# Patient Record
Sex: Female | Born: 1980 | Race: Black or African American | Hispanic: No | Marital: Single | Smoking: Current some day smoker
Health system: Southern US, Community
[De-identification: ages and names within clinical notes are randomized; demographics above are authoritative.]

## PROBLEM LIST (undated history)

## (undated) ENCOUNTER — Emergency Department (HOSPITAL_COMMUNITY): Admission: EM | Payer: Medicaid Other | Source: Home / Self Care

## (undated) DIAGNOSIS — F419 Anxiety disorder, unspecified: Secondary | ICD-10-CM

## (undated) DIAGNOSIS — J40 Bronchitis, not specified as acute or chronic: Secondary | ICD-10-CM

## (undated) DIAGNOSIS — D649 Anemia, unspecified: Secondary | ICD-10-CM

## (undated) DIAGNOSIS — J45909 Unspecified asthma, uncomplicated: Secondary | ICD-10-CM

## (undated) DIAGNOSIS — M549 Dorsalgia, unspecified: Secondary | ICD-10-CM

## (undated) DIAGNOSIS — F32A Depression, unspecified: Secondary | ICD-10-CM

## (undated) HISTORY — PX: TUBAL LIGATION: SHX77

---
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
## 2002-03-07 ENCOUNTER — Ambulatory Visit (HOSPITAL_COMMUNITY): Admission: RE | Admit: 2002-03-07 | Discharge: 2002-03-07 | Payer: Self-pay | Admitting: *Deleted

## 2002-06-13 ENCOUNTER — Inpatient Hospital Stay (HOSPITAL_COMMUNITY): Admission: AD | Admit: 2002-06-13 | Discharge: 2002-06-13 | Payer: Self-pay | Admitting: *Deleted

## 2002-06-14 ENCOUNTER — Inpatient Hospital Stay (HOSPITAL_COMMUNITY): Admission: AD | Admit: 2002-06-14 | Discharge: 2002-06-17 | Payer: Self-pay | Admitting: Obstetrics and Gynecology

## 2002-12-31 ENCOUNTER — Emergency Department (HOSPITAL_COMMUNITY): Admission: EM | Admit: 2002-12-31 | Discharge: 2002-12-31 | Payer: Self-pay | Admitting: Emergency Medicine

## 2003-01-03 ENCOUNTER — Emergency Department (HOSPITAL_COMMUNITY): Admission: EM | Admit: 2003-01-03 | Discharge: 2003-01-03 | Payer: Self-pay | Admitting: Emergency Medicine

## 2003-01-04 ENCOUNTER — Emergency Department (HOSPITAL_COMMUNITY): Admission: EM | Admit: 2003-01-04 | Discharge: 2003-01-04 | Payer: Self-pay | Admitting: Emergency Medicine

## 2003-01-12 ENCOUNTER — Emergency Department (HOSPITAL_COMMUNITY): Admission: EM | Admit: 2003-01-12 | Discharge: 2003-01-12 | Payer: Self-pay | Admitting: Emergency Medicine

## 2003-12-30 ENCOUNTER — Emergency Department (HOSPITAL_COMMUNITY): Admission: EM | Admit: 2003-12-30 | Discharge: 2003-12-30 | Payer: Self-pay | Admitting: Emergency Medicine

## 2004-03-15 ENCOUNTER — Emergency Department (HOSPITAL_COMMUNITY): Admission: EM | Admit: 2004-03-15 | Discharge: 2004-03-15 | Payer: Self-pay | Admitting: Family Medicine

## 2005-02-06 ENCOUNTER — Emergency Department (HOSPITAL_COMMUNITY): Admission: EM | Admit: 2005-02-06 | Discharge: 2005-02-06 | Payer: Self-pay | Admitting: Emergency Medicine

## 2006-01-24 ENCOUNTER — Emergency Department (HOSPITAL_COMMUNITY): Admission: EM | Admit: 2006-01-24 | Discharge: 2006-01-24 | Payer: Self-pay | Admitting: Emergency Medicine

## 2006-01-25 ENCOUNTER — Emergency Department (HOSPITAL_COMMUNITY): Admission: EM | Admit: 2006-01-25 | Discharge: 2006-01-25 | Payer: Self-pay | Admitting: Emergency Medicine

## 2009-01-08 ENCOUNTER — Inpatient Hospital Stay (HOSPITAL_COMMUNITY): Admission: AD | Admit: 2009-01-08 | Discharge: 2009-01-08 | Payer: Self-pay | Admitting: Obstetrics & Gynecology

## 2009-04-02 ENCOUNTER — Ambulatory Visit (HOSPITAL_COMMUNITY): Admission: RE | Admit: 2009-04-02 | Discharge: 2009-04-02 | Payer: Self-pay | Admitting: Obstetrics & Gynecology

## 2009-05-01 ENCOUNTER — Ambulatory Visit (HOSPITAL_COMMUNITY): Admission: RE | Admit: 2009-05-01 | Discharge: 2009-05-01 | Payer: Self-pay | Admitting: Family Medicine

## 2009-07-09 ENCOUNTER — Ambulatory Visit: Payer: Self-pay | Admitting: Family

## 2009-07-09 ENCOUNTER — Inpatient Hospital Stay (HOSPITAL_COMMUNITY): Admission: AD | Admit: 2009-07-09 | Discharge: 2009-07-09 | Payer: Self-pay | Admitting: Obstetrics & Gynecology

## 2009-07-25 ENCOUNTER — Inpatient Hospital Stay (HOSPITAL_COMMUNITY): Admission: AD | Admit: 2009-07-25 | Discharge: 2009-07-25 | Payer: Self-pay | Admitting: Obstetrics & Gynecology

## 2009-08-09 ENCOUNTER — Inpatient Hospital Stay (HOSPITAL_COMMUNITY): Admission: AD | Admit: 2009-08-09 | Discharge: 2009-08-09 | Payer: Self-pay | Admitting: Obstetrics & Gynecology

## 2009-08-09 ENCOUNTER — Ambulatory Visit: Payer: Self-pay | Admitting: Family Medicine

## 2009-08-12 ENCOUNTER — Ambulatory Visit: Payer: Self-pay | Admitting: Obstetrics and Gynecology

## 2009-08-12 ENCOUNTER — Inpatient Hospital Stay (HOSPITAL_COMMUNITY): Admission: AD | Admit: 2009-08-12 | Discharge: 2009-08-12 | Payer: Self-pay | Admitting: Obstetrics & Gynecology

## 2009-08-16 ENCOUNTER — Inpatient Hospital Stay (HOSPITAL_COMMUNITY): Admission: AD | Admit: 2009-08-16 | Discharge: 2009-08-18 | Payer: Self-pay | Admitting: Obstetrics & Gynecology

## 2009-08-16 ENCOUNTER — Ambulatory Visit: Payer: Self-pay | Admitting: Advanced Practice Midwife

## 2009-10-16 ENCOUNTER — Ambulatory Visit: Payer: Self-pay | Admitting: Obstetrics and Gynecology

## 2009-10-16 LAB — CONVERTED CEMR LAB
HCT: 32.6 % — ABNORMAL LOW (ref 36.0–46.0)
Hemoglobin: 10.5 g/dL — ABNORMAL LOW (ref 12.0–15.0)
WBC: 5.4 10*3/uL (ref 4.0–10.5)

## 2009-11-14 ENCOUNTER — Ambulatory Visit: Payer: Self-pay | Admitting: Obstetrics & Gynecology

## 2009-11-25 ENCOUNTER — Ambulatory Visit: Payer: Self-pay | Admitting: Obstetrics & Gynecology

## 2009-11-25 ENCOUNTER — Ambulatory Visit (HOSPITAL_COMMUNITY): Admission: RE | Admit: 2009-11-25 | Discharge: 2009-11-25 | Payer: Self-pay | Admitting: Obstetrics & Gynecology

## 2010-08-31 LAB — WET PREP, GENITAL
Clue Cells Wet Prep HPF POC: NONE SEEN
Trich, Wet Prep: NONE SEEN

## 2010-09-01 LAB — URINALYSIS, ROUTINE W REFLEX MICROSCOPIC
Glucose, UA: NEGATIVE mg/dL
Leukocytes, UA: NEGATIVE
Nitrite: NEGATIVE
Protein, ur: NEGATIVE mg/dL
pH: 6 (ref 5.0–8.0)

## 2010-09-01 LAB — CBC
Hemoglobin: 12.8 g/dL (ref 12.0–15.0)
RBC: 4.65 MIL/uL (ref 3.87–5.11)
RDW: 17.1 % — ABNORMAL HIGH (ref 11.5–15.5)

## 2010-09-01 LAB — URINE MICROSCOPIC-ADD ON

## 2010-09-01 LAB — PREGNANCY, URINE: Preg Test, Ur: NEGATIVE

## 2010-09-07 LAB — RPR: RPR Ser Ql: NONREACTIVE

## 2010-09-07 LAB — CBC
Hemoglobin: 12.1 g/dL (ref 12.0–15.0)
MCHC: 32.5 g/dL (ref 30.0–36.0)
Platelets: 166 10*3/uL (ref 150–400)
RDW: 16 % — ABNORMAL HIGH (ref 11.5–15.5)

## 2010-09-07 LAB — RAPID URINE DRUG SCREEN, HOSP PERFORMED
Amphetamines: NOT DETECTED
Barbiturates: NOT DETECTED
Opiates: NOT DETECTED

## 2010-09-21 LAB — COMPREHENSIVE METABOLIC PANEL
AST: 16 U/L (ref 0–37)
Albumin: 3.5 g/dL (ref 3.5–5.2)
Alkaline Phosphatase: 46 U/L (ref 39–117)
Chloride: 104 mEq/L (ref 96–112)
GFR calc Af Amer: >60 mL/min (ref >60)
Potassium: 3.7 mEq/L (ref 3.5–5.1)
Sodium: 135 mEq/L (ref 135–145)
Total Bilirubin: 0.7 mg/dL (ref 0.3–1.2)
Total Protein: 5.8 g/dL — ABNORMAL LOW (ref 6.0–8.3)

## 2010-09-21 LAB — URINE CULTURE

## 2010-09-21 LAB — URINALYSIS, ROUTINE W REFLEX MICROSCOPIC
Bilirubin Urine: NEGATIVE
Glucose, UA: NEGATIVE mg/dL
Hgb urine dipstick: NEGATIVE
Specific Gravity, Urine: 1.02 (ref 1.005–1.030)
pH: 6 (ref 5.0–8.0)

## 2010-09-21 LAB — URINE MICROSCOPIC-ADD ON

## 2013-06-13 ENCOUNTER — Emergency Department (HOSPITAL_COMMUNITY)
Admission: EM | Admit: 2013-06-13 | Discharge: 2013-06-13 | Disposition: A | Discharge status: Home / Self Care | Payer: Medicaid Other | Attending: Emergency Medicine | Admitting: Emergency Medicine

## 2013-06-13 ENCOUNTER — Encounter (HOSPITAL_COMMUNITY): Payer: Self-pay | Admitting: Emergency Medicine

## 2013-06-13 DIAGNOSIS — J029 Acute pharyngitis, unspecified: Secondary | ICD-10-CM | POA: Insufficient documentation

## 2013-06-13 DIAGNOSIS — H9209 Otalgia, unspecified ear: Secondary | ICD-10-CM | POA: Insufficient documentation

## 2013-06-13 DIAGNOSIS — F17200 Nicotine dependence, unspecified, uncomplicated: Secondary | ICD-10-CM | POA: Insufficient documentation

## 2013-06-13 DIAGNOSIS — G43909 Migraine, unspecified, not intractable, without status migrainosus: Secondary | ICD-10-CM

## 2013-06-13 MED ORDER — BUTALBITAL-APAP-CAFFEINE 50-325-40 MG PO TABS: 1.0000 | ORAL_TABLET | Freq: Four times a day (QID) | ORAL | Status: DC | PRN

## 2013-06-13 MED ORDER — DIPHENHYDRAMINE HCL 25 MG PO CAPS
50.0000 mg | ORAL_CAPSULE | Freq: Once | ORAL | Status: AC
  Administered 2013-06-13: 50 mg via ORAL
  Filled 2013-06-13: qty 2

## 2013-06-13 MED ORDER — PROCHLORPERAZINE MALEATE 10 MG PO TABS
10.0000 mg | ORAL_TABLET | Freq: Once | ORAL | Status: AC
  Administered 2013-06-13: 10 mg via ORAL
  Filled 2013-06-13: qty 1

## 2013-06-13 MED ORDER — KETOROLAC TROMETHAMINE 60 MG/2ML IM SOLN
60.0000 mg | Freq: Once | INTRAMUSCULAR | Status: AC
  Administered 2013-06-13: 60 mg via INTRAMUSCULAR
  Filled 2013-06-13: qty 2

## 2013-06-13 NOTE — ED Provider Notes (Signed)
CSN: [id number]     Arrival date & time 06/13/13  1148 History   First MD Initiated Contact with Patient 06/13/13 1211  This chart was scribed for non-physician practitioner Trixie Dredge, PA-C working with Enid Skeens, MD by Valera Castle, ED scribe. This patient was seen in room WTR8/WTR8 and the patient's care was started at 12:40 PM.    Chief Complaint  Patient presents with  . Facial Pain   The history is provided by the patient. No language interpreter was used.   HPI Comments: Becky Pham is a 32 y.o. female who presents to the Emergency Department complaining of gradually worsening, throbbing, constant, right sided facial pain and right sided headache, with associated right sided sore throat and right ear pain, onset 4 days ago. She reports her facial pain and headache are exacerbated by light and sound and states that her pain is worse in the evenings. She states she thinks she might have an ear infection. She reports taking extra strength Aspirin, without relief. She also reports trying Canola oil in her ear, with no relief. She reports her children have had colds, but states she was sick before them, and they are without symptoms now. She denies fever, chills, body aches, dental pain, rhinorrhea, double vision, blurry vision, nausea, vomiting, dizziness, head trauma, and any other associated symptoms. She denies h/o migraines.   PCP - No primary provider on file.   History reviewed. No pertinent past medical history. Past Surgical History  Procedure Laterality Date  . Tubal ligation     No family history on file. History  Substance Use Topics  . Smoking status: Current Every Day Smoker -- 0.50 packs/day    Types: Cigarettes  . Smokeless tobacco: Not on file  . Alcohol Use: No   OB History   Grav Para Term Preterm Abortions TAB SAB Ect Mult Living                 Review of Systems  Constitutional: Negative for fever and chills.  HENT: Positive for ear pain (right),  facial swelling and sore throat (right). Negative for dental problem and rhinorrhea.   Eyes: Negative for visual disturbance.  Gastrointestinal: Negative for nausea and vomiting.  Musculoskeletal: Negative for myalgias.  Neurological: Positive for headaches (right). Negative for dizziness.    Allergies  Review of patient's allergies indicates no known allergies.  Home Medications  No current outpatient prescriptions on file.  BP 127/94  Pulse 83  Temp(Src) 98.8 F (37.1 C) (Oral)  Resp 16  SpO2 99%  LMP 05/30/2013  Physical Exam  Nursing note and vitals reviewed. Constitutional: She appears well-developed and well-nourished. No distress.  HENT:  Head: Normocephalic and atraumatic.  Right Ear: Tympanic membrane, external ear and ear canal normal.  Left Ear: Tympanic membrane, external ear and ear canal normal.  Nose: Right sinus exhibits no maxillary sinus tenderness and no frontal sinus tenderness. Left sinus exhibits no maxillary sinus tenderness and no frontal sinus tenderness.  Mouth/Throat: Uvula is midline, oropharynx is clear and moist and mucous membranes are normal. No oropharyngeal exudate, posterior oropharyngeal edema, posterior oropharyngeal erythema or tonsillar abscesses.  No abnormal dentition. No oral lesions.   Eyes: Conjunctivae and EOM are normal. Right eye exhibits no discharge. Left eye exhibits no discharge.  Neck: Neck supple. No thyromegaly present.  No lymphadenopathy of head and neck.   Pulmonary/Chest: Effort normal.  Lymphadenopathy:    She has no cervical adenopathy.  Right: No supraclavicular adenopathy present.       Left: No supraclavicular adenopathy present.  Neurological: She is alert.  CN II-XII intact, EOMs intact, no pronator drift, grip strengths equal bilaterally; strength 5/5 in all extremities, sensation intact in all extremities; finger to nose, heel to shin, rapid alternating movements normal; gait is normal.    Skin: She is  not diaphoretic.    ED Course  Procedures (including critical care time)  DIAGNOSTIC STUDIES: Oxygen Saturation is 99% on room air, normal by my interpretation.    COORDINATION OF CARE: 12:47 PM-Discussed treatment plan which includes clinical suspicion of migraine with pt at bedside and pt agreed to plan.   Labs Review Labs Reviewed - No data to display Imaging Review No results found.  EKG Interpretation   None      No orders of the defined types were placed in this encounter.    MDM   1. Migraine headache    Pt with gradual onset right sided headache and facial pain with sensitivity to light and sound x 4 days.  No e/o infectious process causing her symptoms.  I suspect this is a migraine headache.  There are no red flags associated with the headache.  No trauma, no meningeal signs, onset was gradual. Neuro exam is normal.  Pt given toradol, benadryl, compazine in ED and d/c home with fioricet, close PCP follow up.  Discussed findings, treatment, and follow up  with patient.  Pt given return precautions.  Pt verbalizes understanding and agrees with plan.        I personally performed the services described in this documentation, which was scribed in my presence. The recorded information has been reviewed and is accurate.    [place], PA-C 06/13/13 [phone]

## 2013-06-13 NOTE — Progress Notes (Signed)
   CARE MANAGEMENT ED NOTE 06/13/2013  Patient:  Becky Pham, Becky Pham   Account Number:  [id number]  Date Initiated:  06/13/2013  Documentation initiated by:  Edd Arbour  Subjective/Objective Assessment:   32 yr old female medicaid [place] access who confirms the [facility] is her pcp - dr Fleet Contras     Subjective/Objective Assessment Detail:     Action/Plan:   EPIC updated   Action/Plan Detail:   Anticipated DC Date:  06/13/2013     Status Recommendation to Physician:   Result of Recommendation:    Other ED Services  Consult Working Plan    DC Planning Services  Other  PCP issues  Outpatient Services - Pt will follow up    Choice offered to / List presented to:            Status of service:  Completed, signed off  ED Comments:   ED Comments Detail:

## 2013-06-13 NOTE — ED Notes (Addendum)
Pt from home c/o R sided facial pain x4 days. Pt states that she thinks she may have an earache, but not sure. Pt facial pain preventing sleep. Pt reports pain in roof of mouth,R eye, throat, and R side of face. Pt denies N/V/D, dizziness, SOB, cough, weakness. Pt is A&O and in NAD

## 2013-06-13 NOTE — ED Provider Notes (Signed)
Medical screening examination/treatment/procedure(s) were performed by non-physician practitioner and as supervising physician I was immediately available for consultation/collaboration.  EKG Interpretation   None         Noreta Kue M Stefon Ramthun, MD 06/13/13 1555 

## 2014-03-05 ENCOUNTER — Encounter (HOSPITAL_BASED_OUTPATIENT_CLINIC_OR_DEPARTMENT_OTHER): Payer: Self-pay | Admitting: Emergency Medicine

## 2014-03-05 ENCOUNTER — Emergency Department (HOSPITAL_BASED_OUTPATIENT_CLINIC_OR_DEPARTMENT_OTHER)
Admission: EM | Admit: 2014-03-05 | Discharge: 2014-03-05 | Disposition: A | Discharge status: Home / Self Care | Payer: Medicaid Other | Attending: Emergency Medicine | Admitting: Emergency Medicine

## 2014-03-05 DIAGNOSIS — N760 Acute vaginitis: Secondary | ICD-10-CM | POA: Diagnosis not present

## 2014-03-05 DIAGNOSIS — A499 Bacterial infection, unspecified: Secondary | ICD-10-CM | POA: Insufficient documentation

## 2014-03-05 DIAGNOSIS — B9689 Other specified bacterial agents as the cause of diseases classified elsewhere: Secondary | ICD-10-CM | POA: Insufficient documentation

## 2014-03-05 DIAGNOSIS — R109 Unspecified abdominal pain: Secondary | ICD-10-CM | POA: Diagnosis present

## 2014-03-05 DIAGNOSIS — Z9851 Tubal ligation status: Secondary | ICD-10-CM | POA: Diagnosis not present

## 2014-03-05 DIAGNOSIS — Z3202 Encounter for pregnancy test, result negative: Secondary | ICD-10-CM | POA: Insufficient documentation

## 2014-03-05 DIAGNOSIS — E669 Obesity, unspecified: Secondary | ICD-10-CM | POA: Insufficient documentation

## 2014-03-05 DIAGNOSIS — F17200 Nicotine dependence, unspecified, uncomplicated: Secondary | ICD-10-CM | POA: Diagnosis not present

## 2014-03-05 LAB — URINALYSIS, ROUTINE W REFLEX MICROSCOPIC
BILIRUBIN URINE: NEGATIVE
Glucose, UA: NEGATIVE mg/dL
HGB URINE DIPSTICK: NEGATIVE
Ketones, ur: NEGATIVE mg/dL
Nitrite: NEGATIVE
Protein, ur: NEGATIVE mg/dL
SPECIFIC GRAVITY, URINE: 1.014 (ref 1.005–1.030)
UROBILINOGEN UA: 0.2 mg/dL (ref 0.0–1.0)
pH: 5.5 (ref 5.0–8.0)

## 2014-03-05 LAB — RPR

## 2014-03-05 LAB — PREGNANCY, URINE: Preg Test, Ur: NEGATIVE

## 2014-03-05 LAB — WET PREP, GENITAL: Yeast Wet Prep HPF POC: NONE SEEN

## 2014-03-05 LAB — URINE MICROSCOPIC-ADD ON

## 2014-03-05 LAB — HIV ANTIBODY (ROUTINE TESTING W REFLEX): HIV: NONREACTIVE

## 2014-03-05 MED ORDER — CEFTRIAXONE SODIUM 250 MG IJ SOLR
250.0000 mg | Freq: Once | INTRAMUSCULAR | Status: AC
  Administered 2014-03-05: 250 mg via INTRAMUSCULAR
  Filled 2014-03-05: qty 250

## 2014-03-05 MED ORDER — AZITHROMYCIN 250 MG PO TABS
1000.0000 mg | ORAL_TABLET | Freq: Once | ORAL | Status: AC
  Administered 2014-03-05: 1000 mg via ORAL
  Filled 2014-03-05: qty 4

## 2014-03-05 MED ORDER — LIDOCAINE HCL (PF) 1 % IJ SOLN
INTRAMUSCULAR | Status: AC
  Administered 2014-03-05: 5 mL
  Filled 2014-03-05: qty 5

## 2014-03-05 MED ORDER — METRONIDAZOLE 500 MG PO TABS
2000.0000 mg | ORAL_TABLET | Freq: Once | ORAL | Status: AC
  Administered 2014-03-05: 2000 mg via ORAL
  Filled 2014-03-05: qty 4

## 2014-03-05 NOTE — Discharge Instructions (Signed)
Bacterial Vaginosis Bacterial vaginosis is a vaginal infection that occurs when the normal balance of bacteria in the vagina is disrupted. It results from an overgrowth of certain bacteria. This is the most common vaginal infection in women of childbearing age. Treatment is important to prevent complications, especially in pregnant women, as it can cause a premature delivery. CAUSES  Bacterial vaginosis is caused by an increase in harmful bacteria that are normally present in smaller amounts in the vagina. Several different kinds of bacteria can cause bacterial vaginosis. However, the reason that the condition develops is not fully understood. RISK FACTORS Certain activities or behaviors can put you at an increased risk of developing bacterial vaginosis, including:  Having a new sex partner or multiple sex partners.  Douching.  Using an intrauterine device (IUD) for contraception. Women do not get bacterial vaginosis from toilet seats, bedding, swimming pools, or contact with objects around them. SIGNS AND SYMPTOMS  Some women with bacterial vaginosis have no signs or symptoms. Common symptoms include:  Grey vaginal discharge.  A fishlike odor with discharge, especially after sexual intercourse.  Itching or burning of the vagina and vulva.  Burning or pain with urination. DIAGNOSIS  Your health care provider will take a medical history and examine the vagina for signs of bacterial vaginosis. A sample of vaginal fluid may be taken. Your health care provider will look at this sample under a microscope to check for bacteria and abnormal cells. A vaginal pH test may also be done.  TREATMENT  Bacterial vaginosis may be treated with antibiotic medicines. These may be given in the form of a pill or a vaginal cream. A second round of antibiotics may be prescribed if the condition comes back after treatment.  HOME CARE INSTRUCTIONS   Only take over-the-counter or prescription medicines as  directed by your health care provider.  If antibiotic medicine was prescribed, take it as directed. Make sure you finish it even if you start to feel better.  Do not have sex until treatment is completed.  Tell all sexual partners that you have a vaginal infection. They should see their health care provider and be treated if they have problems, such as a mild rash or itching.  Practice safe sex by using condoms and only having one sex partner. SEEK MEDICAL CARE IF:   Your symptoms are not improving after 3 days of treatment.  You have increased discharge or pain.  You have a fever. MAKE SURE YOU:   Understand these instructions.  Will watch your condition.  Will get help right away if you are not doing well or get worse. FOR MORE INFORMATION  Centers for Disease Control and Prevention, Division of STD Prevention: www.cdc.gov/std American Sexual Health Association (ASHA): www.ashastd.org  Document Released: 06/01/2005 Document Revised: 03/22/2013 Document Reviewed: 01/11/2013 ExitCare Patient Information 2015 [employer]. This information is not intended to replace advice given to you by your health care provider. Make sure you discuss any questions you have with your health care provider.  

## 2014-03-05 NOTE — ED Provider Notes (Signed)
CSN: [id number]     Arrival date & time 03/05/14  1306 History   First MD Initiated Contact with Patient 03/05/14 1354     Chief Complaint  Patient presents with  . Abdominal Pain     (Consider location/radiation/quality/duration/timing/severity/associated sxs/prior Treatment) HPI 33 y.o female g2p2 complaining of suprapubic discomfort and vaginal discharge.  Patient states pain present for three weeks.  Patient states she was treated for trichomonas but smell never went away.  She denies uti symptoms to me including dysuria (although it is noted in nursing note.).  LMP two months ago with only spotting last month.  Patient s/p btl four years ago.  History reviewed. No pertinent past medical history. Past Surgical History  Procedure Laterality Date  . Tubal ligation     No family history on file. History  Substance Use Topics  . Smoking status: Current Every Day Smoker -- 0.50 packs/day    Types: Cigarettes  . Smokeless tobacco: Not on file  . Alcohol Use: No   OB History   Grav Para Term Preterm Abortions TAB SAB Ect Mult Living                 Review of Systems  All other systems reviewed and are negative.     Allergies  Review of patient's allergies indicates no known allergies.  Home Medications   Prior to Admission medications   Medication Sig Start Date End Date Taking? Authorizing Provider  butalbital-acetaminophen-caffeine (FIORICET) 50-325-40 MG per tablet Take 1 tablet by mouth every 6 (six) hours as needed for headache. 06/13/13 06/13/14  Trixie Dredge, PA-C   BP 134/71  Pulse 92  Temp(Src) 99 F (37.2 C) (Oral)  Resp 16  Ht  (1.626 m)  Wt 189 lb (85.73 kg)  BMI 32.43 kg/m2  SpO2 100%  LMP 02/04/2014 Physical Exam  Nursing note and vitals reviewed. Constitutional: She appears well-developed and well-nourished.  Obese  HENT:  Head: Normocephalic and atraumatic.  Right Ear: External ear normal.  Left Ear: External ear normal.  Nose: Nose  normal.  Mouth/Throat: Oropharynx is clear and moist.  Eyes: Conjunctivae and EOM are normal. Pupils are equal, round, and reactive to light.  Neck: Normal range of motion. Neck supple. No thyromegaly present.  Cardiovascular: Normal rate.   Pulmonary/Chest: Effort normal.  Abdominal: Soft. Bowel sounds are normal. There is no tenderness.  Genitourinary: Pelvic exam was performed with patient supine. Uterus is enlarged and tender. Uterus is not deviated and not fixed. Cervix exhibits discharge. Cervix exhibits no motion tenderness. Right adnexum displays no mass, no tenderness and no fullness. Left adnexum displays no mass, no tenderness and no fullness. No tenderness or bleeding around the vagina. No vaginal discharge found.  Mild ttp bimanaul over uterus    ED Course  Procedures (including critical care time) Labs Review Labs Reviewed  URINALYSIS, ROUTINE W REFLEX MICROSCOPIC - Abnormal; Notable for the following:    Leukocytes, UA TRACE (*)    All other components within normal limits  GC/CHLAMYDIA PROBE AMP  WET PREP, GENITAL  PREGNANCY, URINE  URINE MICROSCOPIC-ADD ON  RPR  HIV ANTIBODY (ROUTINE TESTING)    Imaging Review No results found.   EKG Interpretation None      MDM   Final diagnoses:  BV (bacterial vaginosis)       Hilario Quarry, MD 03/05/14 [phone]

## 2014-03-05 NOTE — ED Notes (Signed)
abd pain x 3 weeks, dysuria, vaginal d/c

## 2014-03-06 LAB — GC/CHLAMYDIA PROBE AMP
CT Probe RNA: NEGATIVE
GC Probe RNA: NEGATIVE

## 2014-03-10 ENCOUNTER — Encounter (HOSPITAL_BASED_OUTPATIENT_CLINIC_OR_DEPARTMENT_OTHER): Payer: Self-pay | Admitting: Emergency Medicine

## 2014-03-10 ENCOUNTER — Emergency Department (HOSPITAL_BASED_OUTPATIENT_CLINIC_OR_DEPARTMENT_OTHER)
Admission: EM | Admit: 2014-03-10 | Discharge: 2014-03-10 | Disposition: A | Discharge status: Home / Self Care | Payer: Medicaid Other | Attending: Emergency Medicine | Admitting: Emergency Medicine

## 2014-03-10 ENCOUNTER — Emergency Department (HOSPITAL_BASED_OUTPATIENT_CLINIC_OR_DEPARTMENT_OTHER): Payer: Medicaid Other

## 2014-03-10 DIAGNOSIS — R0789 Other chest pain: Secondary | ICD-10-CM

## 2014-03-10 DIAGNOSIS — R079 Chest pain, unspecified: Secondary | ICD-10-CM | POA: Insufficient documentation

## 2014-03-10 DIAGNOSIS — R071 Chest pain on breathing: Secondary | ICD-10-CM | POA: Diagnosis not present

## 2014-03-10 DIAGNOSIS — Z8709 Personal history of other diseases of the respiratory system: Secondary | ICD-10-CM | POA: Insufficient documentation

## 2014-03-10 DIAGNOSIS — F17200 Nicotine dependence, unspecified, uncomplicated: Secondary | ICD-10-CM | POA: Insufficient documentation

## 2014-03-10 HISTORY — DX: Bronchitis, not specified as acute or chronic: J40

## 2014-03-10 MED ORDER — IBUPROFEN 400 MG PO TABS
600.0000 mg | ORAL_TABLET | Freq: Once | ORAL | Status: AC
  Administered 2014-03-10: 600 mg via ORAL
  Filled 2014-03-10 (×2): qty 1

## 2014-03-10 NOTE — ED Provider Notes (Signed)
CSN: [id number]     Arrival date & time 03/10/14  1109 History   First MD Initiated Contact with Patient 03/10/14 1115     Chief Complaint  Patient presents with  . Chest Pain     (Consider location/radiation/quality/duration/timing/severity/associated sxs/prior Treatment) HPI  This is a 33 year old female who presents from day marked with chest pain. Patient reports a three-day history of chest pain that has been constant and is worse with movement. She reports the pain is sharp and nonradiating. She does have a history of smoking and bronchitis. She denies any coughing. She's not taken anything for pain. She denies any fevers. She denies any history of blood clots, estrogen use, recent hospitalizations.  Current pain is 8/10.  Past Medical History  Diagnosis Date  . Bronchitis    Past Surgical History  Procedure Laterality Date  . Tubal ligation     No family history on file. History  Substance Use Topics  . Smoking status: Current Every Day Smoker -- 0.50 packs/day    Types: Cigarettes  . Smokeless tobacco: Not on file  . Alcohol Use: No   OB History   Grav Para Term Preterm Abortions TAB SAB Ect Mult Living                 Review of Systems  Constitutional: Negative for fever.  Respiratory: Positive for chest tightness. Negative for cough and shortness of breath.   Cardiovascular: Positive for chest pain. Negative for leg swelling.  Gastrointestinal: Negative for nausea, vomiting and abdominal pain.  Genitourinary: Negative for dysuria.  Neurological: Negative for headaches.  All other systems reviewed and are negative.     Allergies  Review of patient's allergies indicates no known allergies.  Home Medications   Prior to Admission medications   Not on File   BP 118/83  Pulse 71  Temp(Src) 99 F (37.2 C) (Oral)  Resp 13  Wt 189 lb (85.73 kg)  SpO2 100%  LMP 02/04/2014 Physical Exam  Nursing note and vitals reviewed. Constitutional: She is oriented  to person, place, and time. She appears well-developed and well-nourished. No distress.  HENT:  Head: Normocephalic and atraumatic.  Cardiovascular: Normal rate, regular rhythm and normal heart sounds.   No murmur heard. Pulmonary/Chest: Effort normal and breath sounds normal. No respiratory distress. She has no wheezes. She exhibits tenderness.  TTP the anterior chest wall without crepitus  Abdominal: Soft. Bowel sounds are normal. There is no tenderness. There is no rebound.  Musculoskeletal: She exhibits no edema.  Neurological: She is alert and oriented to person, place, and time.  Skin: Skin is warm and dry.  Psychiatric: She has a normal mood and affect.    ED Course  Procedures (including critical care time) Labs Review Labs Reviewed - No data to display  Imaging Review Dg Chest 2 View  03/10/2014   CLINICAL DATA:  Right-sided chest pain for 3 days, gradually worsening, pain radiating to back, smoker  EXAM: CHEST  2 VIEW  COMPARISON:  01/25/2006  FINDINGS: Normal heart size, mediastinal contours, and pulmonary vascularity.  Lungs clear.  Minimal chronic peribronchial thickening, slightly improved.  No pleural effusion or pneumothorax.  Bones unremarkable.  IMPRESSION: No acute abnormalities.   Electronically Signed   By: Ulyses Southward M.D.   On: 03/10/2014 12:29     EKG Interpretation   Date/Time:  Saturday March 10 2014 11:16:15 EDT Ventricular Rate:  65 PR Interval:  128 QRS Duration: 82 QT Interval:  392 QTC Calculation:  407 R Axis:   85 Text Interpretation:  Normal sinus rhythm Normal ECG No prior for  comparison Confirmed by Tauriel Scronce  MD, Diane Mochizuki ([id number]) on 03/10/2014 11:18:23  AM      MDM   Final diagnoses:  Chest wall pain   Patient presents with chest pain for 3 days. Is worse with movement. She is nontoxic nonfocal on exam. EKG is normal 1 chest x-ray shows no signs of pneumothorax or pleural effusion. She's not wheezing on exam and no indication of  bronchitis. She does have tenderness to palpation suggestive of chest wall pain. Patient was given Toradol with some improvement of her pain. PERC neg.  Discussed with patient that I feel her pain is likely musculoskeletal in nature. She can take ibuprofen as needed.  After history, exam, and medical workup I feel the patient has been appropriately medically screened and is safe for discharge home. Pertinent diagnoses were discussed with the patient. Patient was given return precautions.    Shon Baton, MD 03/11/14 [phone]

## 2014-03-10 NOTE — ED Notes (Signed)
D/c back to [facility]- pt given ice pack and warm pack for home use- [facility] form filled out by EDP Horton and returned to pt at d/c

## 2014-03-10 NOTE — Discharge Instructions (Signed)

## 2014-09-08 ENCOUNTER — Emergency Department (HOSPITAL_BASED_OUTPATIENT_CLINIC_OR_DEPARTMENT_OTHER)
Admission: EM | Admit: 2014-09-08 | Discharge: 2014-09-08 | Disposition: A | Discharge status: Home / Self Care | Payer: Medicaid Other | Attending: Emergency Medicine | Admitting: Emergency Medicine

## 2014-09-08 ENCOUNTER — Encounter (HOSPITAL_BASED_OUTPATIENT_CLINIC_OR_DEPARTMENT_OTHER): Payer: Self-pay

## 2014-09-08 DIAGNOSIS — Z720 Tobacco use: Secondary | ICD-10-CM | POA: Diagnosis not present

## 2014-09-08 DIAGNOSIS — R109 Unspecified abdominal pain: Secondary | ICD-10-CM | POA: Diagnosis present

## 2014-09-08 DIAGNOSIS — A5900 Urogenital trichomoniasis, unspecified: Secondary | ICD-10-CM | POA: Diagnosis not present

## 2014-09-08 DIAGNOSIS — Z3202 Encounter for pregnancy test, result negative: Secondary | ICD-10-CM | POA: Insufficient documentation

## 2014-09-08 DIAGNOSIS — Z8709 Personal history of other diseases of the respiratory system: Secondary | ICD-10-CM | POA: Insufficient documentation

## 2014-09-08 DIAGNOSIS — F419 Anxiety disorder, unspecified: Secondary | ICD-10-CM | POA: Diagnosis not present

## 2014-09-08 DIAGNOSIS — A5901 Trichomonal vulvovaginitis: Secondary | ICD-10-CM

## 2014-09-08 DIAGNOSIS — M549 Dorsalgia, unspecified: Secondary | ICD-10-CM | POA: Insufficient documentation

## 2014-09-08 LAB — URINALYSIS, ROUTINE W REFLEX MICROSCOPIC
BILIRUBIN URINE: NEGATIVE
Glucose, UA: NEGATIVE mg/dL
HGB URINE DIPSTICK: NEGATIVE
KETONES UR: NEGATIVE mg/dL
Nitrite: NEGATIVE
Protein, ur: NEGATIVE mg/dL
SPECIFIC GRAVITY, URINE: 1.01 (ref 1.005–1.030)
Urobilinogen, UA: 0.2 mg/dL (ref 0.0–1.0)
pH: 8 (ref 5.0–8.0)

## 2014-09-08 LAB — WET PREP, GENITAL

## 2014-09-08 LAB — URINE MICROSCOPIC-ADD ON

## 2014-09-08 LAB — CBG MONITORING, ED: GLUCOSE-CAPILLARY: 91 mg/dL (ref 70–99)

## 2014-09-08 LAB — PREGNANCY, URINE: PREG TEST UR: NEGATIVE

## 2014-09-08 MED ORDER — PROMETHAZINE HCL 25 MG PO TABS: 25.0000 mg | ORAL_TABLET | Freq: Four times a day (QID) | ORAL | Status: DC | PRN

## 2014-09-08 MED ORDER — AZITHROMYCIN 250 MG PO TABS
1000.0000 mg | ORAL_TABLET | Freq: Once | ORAL | Status: AC
  Administered 2014-09-08: 1000 mg via ORAL
  Filled 2014-09-08: qty 4

## 2014-09-08 MED ORDER — AZITHROMYCIN 1 G PO PACK: 1.0000 g | PACK | Freq: Once | ORAL | Status: DC

## 2014-09-08 MED ORDER — LIDOCAINE HCL (PF) 1 % IJ SOLN
INTRAMUSCULAR | Status: AC
  Administered 2014-09-08: 0.9 mL
  Filled 2014-09-08: qty 5

## 2014-09-08 MED ORDER — CEFTRIAXONE SODIUM 250 MG IJ SOLR
250.0000 mg | Freq: Once | INTRAMUSCULAR | Status: AC
  Administered 2014-09-08: 250 mg via INTRAMUSCULAR
  Filled 2014-09-08: qty 250

## 2014-09-08 MED ORDER — METRONIDAZOLE 500 MG PO TABS
500.0000 mg | ORAL_TABLET | Freq: Once | ORAL | Status: AC
  Administered 2014-09-08: 500 mg via ORAL
  Filled 2014-09-08: qty 1

## 2014-09-08 MED ORDER — METRONIDAZOLE 500 MG PO TABS: 500.0000 mg | ORAL_TABLET | Freq: Two times a day (BID) | ORAL | Status: DC
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
## 2014-09-08 NOTE — Discharge Instructions (Signed)
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
## 2014-09-09 LAB — HIV ANTIBODY (ROUTINE TESTING W REFLEX): HIV SCREEN 4TH GENERATION: NONREACTIVE

## 2014-09-09 LAB — RPR: RPR: NONREACTIVE

## 2014-09-10 LAB — GC/CHLAMYDIA PROBE AMP (~~LOC~~) NOT AT ARMC
CHLAMYDIA, DNA PROBE: NEGATIVE
NEISSERIA GONORRHEA: NEGATIVE

## 2014-10-29 ENCOUNTER — Emergency Department (HOSPITAL_COMMUNITY)
Admission: EM | Admit: 2014-10-29 | Discharge: 2014-10-29 | Disposition: A | Discharge status: Home / Self Care | Payer: Medicaid Other | Attending: Emergency Medicine | Admitting: Emergency Medicine

## 2014-10-29 ENCOUNTER — Encounter (HOSPITAL_COMMUNITY): Payer: Self-pay | Admitting: *Deleted

## 2014-10-29 ENCOUNTER — Emergency Department (HOSPITAL_COMMUNITY): Payer: Medicaid Other

## 2014-10-29 DIAGNOSIS — Y998 Other external cause status: Secondary | ICD-10-CM | POA: Insufficient documentation

## 2014-10-29 DIAGNOSIS — Z792 Long term (current) use of antibiotics: Secondary | ICD-10-CM | POA: Diagnosis not present

## 2014-10-29 DIAGNOSIS — S3992XA Unspecified injury of lower back, initial encounter: Secondary | ICD-10-CM | POA: Diagnosis present

## 2014-10-29 DIAGNOSIS — Z8709 Personal history of other diseases of the respiratory system: Secondary | ICD-10-CM | POA: Insufficient documentation

## 2014-10-29 DIAGNOSIS — S79912A Unspecified injury of left hip, initial encounter: Secondary | ICD-10-CM | POA: Insufficient documentation

## 2014-10-29 DIAGNOSIS — Z720 Tobacco use: Secondary | ICD-10-CM | POA: Diagnosis not present

## 2014-10-29 DIAGNOSIS — Y92410 Unspecified street and highway as the place of occurrence of the external cause: Secondary | ICD-10-CM | POA: Insufficient documentation

## 2014-10-29 DIAGNOSIS — Y9389 Activity, other specified: Secondary | ICD-10-CM | POA: Diagnosis not present

## 2014-10-29 DIAGNOSIS — S199XXA Unspecified injury of neck, initial encounter: Secondary | ICD-10-CM | POA: Insufficient documentation

## 2014-10-29 DIAGNOSIS — M542 Cervicalgia: Secondary | ICD-10-CM

## 2014-10-29 DIAGNOSIS — M549 Dorsalgia, unspecified: Secondary | ICD-10-CM

## 2014-10-29 MED ORDER — NAPROXEN 500 MG PO TABS: 500.0000 mg | ORAL_TABLET | Freq: Two times a day (BID) | ORAL | Status: DC

## 2014-10-29 MED ORDER — METHOCARBAMOL 500 MG PO TABS: 500.0000 mg | ORAL_TABLET | Freq: Two times a day (BID) | ORAL | Status: DC
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
## 2014-10-29 NOTE — Discharge Instructions (Signed)
Take the prescribed medication as directed. °Follow-up with your primary care physician. °Return to the ED for new or worsening symptoms. ° °

## 2014-12-05 ENCOUNTER — Emergency Department (HOSPITAL_COMMUNITY)
Admission: EM | Admit: 2014-12-05 | Discharge: 2014-12-05 | Disposition: A | Discharge status: Home / Self Care | Payer: Medicaid Other | Attending: Emergency Medicine | Admitting: Emergency Medicine

## 2014-12-05 ENCOUNTER — Encounter (HOSPITAL_COMMUNITY): Payer: Self-pay

## 2014-12-05 DIAGNOSIS — O99331 Smoking (tobacco) complicating pregnancy, first trimester: Secondary | ICD-10-CM | POA: Insufficient documentation

## 2014-12-05 DIAGNOSIS — F17219 Nicotine dependence, cigarettes, with unspecified nicotine-induced disorders: Secondary | ICD-10-CM | POA: Diagnosis not present

## 2014-12-05 DIAGNOSIS — Z3A11 11 weeks gestation of pregnancy: Secondary | ICD-10-CM | POA: Diagnosis not present

## 2014-12-05 DIAGNOSIS — N939 Abnormal uterine and vaginal bleeding, unspecified: Secondary | ICD-10-CM

## 2014-12-05 DIAGNOSIS — O209 Hemorrhage in early pregnancy, unspecified: Secondary | ICD-10-CM | POA: Diagnosis present

## 2014-12-05 DIAGNOSIS — Z8709 Personal history of other diseases of the respiratory system: Secondary | ICD-10-CM | POA: Insufficient documentation

## 2014-12-05 LAB — URINALYSIS, ROUTINE W REFLEX MICROSCOPIC
Bilirubin Urine: NEGATIVE
GLUCOSE, UA: NEGATIVE mg/dL
HGB URINE DIPSTICK: NEGATIVE
KETONES UR: NEGATIVE mg/dL
Leukocytes, UA: NEGATIVE
Nitrite: NEGATIVE
Protein, ur: NEGATIVE mg/dL
Specific Gravity, Urine: 1.017 (ref 1.005–1.030)
Urobilinogen, UA: 0.2 mg/dL (ref 0.0–1.0)
pH: 5.5 (ref 5.0–8.0)

## 2014-12-05 LAB — POC URINE PREG, ED: Preg Test, Ur: NEGATIVE

## 2014-12-05 LAB — COMPREHENSIVE METABOLIC PANEL
ALBUMIN: 3.4 g/dL — AB (ref 3.5–5.0)
ALK PHOS: 51 U/L (ref 38–126)
ALT: 12 U/L — AB (ref 14–54)
AST: 14 U/L — ABNORMAL LOW (ref 15–41)
Anion gap: 6 (ref 5–15)
BUN: 8 mg/dL (ref 6–20)
CALCIUM: 8.7 mg/dL — AB (ref 8.9–10.3)
CO2: 25 mmol/L (ref 22–32)
Chloride: 109 mmol/L (ref 101–111)
Creatinine, Ser: 0.94 mg/dL (ref 0.44–1.00)
Glucose, Bld: 99 mg/dL (ref 65–99)
Potassium: 3.9 mmol/L (ref 3.5–5.1)
SODIUM: 140 mmol/L (ref 135–145)
TOTAL PROTEIN: 5.8 g/dL — AB (ref 6.5–8.1)
Total Bilirubin: 0.7 mg/dL (ref 0.3–1.2)

## 2014-12-05 LAB — CBC WITH DIFFERENTIAL/PLATELET
BASOS PCT: 1 % (ref 0–1)
Basophils Absolute: 0.1 10*3/uL (ref 0.0–0.1)
EOS PCT: 4 % (ref 0–5)
Eosinophils Absolute: 0.2 10*3/uL (ref 0.0–0.7)
HEMATOCRIT: 35.7 % — AB (ref 36.0–46.0)
HEMOGLOBIN: 11.8 g/dL — AB (ref 12.0–15.0)
Lymphocytes Relative: 27 % (ref 12–46)
Lymphs Abs: 1.6 10*3/uL (ref 0.7–4.0)
MCH: 26.8 pg (ref 26.0–34.0)
MCHC: 33.1 g/dL (ref 30.0–36.0)
MCV: 81.1 fL (ref 78.0–100.0)
MONO ABS: 0.9 10*3/uL (ref 0.1–1.0)
Monocytes Relative: 14 % — ABNORMAL HIGH (ref 3–12)
Neutro Abs: 3.2 10*3/uL (ref 1.7–7.7)
Neutrophils Relative %: 54 % (ref 43–77)
Platelets: 202 10*3/uL (ref 150–400)
RBC: 4.4 MIL/uL (ref 3.87–5.11)
RDW: 14.2 % (ref 11.5–15.5)
WBC: 5.9 10*3/uL (ref 4.0–10.5)

## 2014-12-05 LAB — LIPASE, BLOOD: Lipase: 18 U/L — ABNORMAL LOW (ref 22–51)

## 2014-12-05 LAB — WET PREP, GENITAL
CLUE CELLS WET PREP: NONE SEEN
Trich, Wet Prep: NONE SEEN
Yeast Wet Prep HPF POC: NONE SEEN

## 2014-12-05 LAB — ABO/RH: ABO/RH(D): B POS

## 2014-12-05 LAB — HCG, QUANTITATIVE, PREGNANCY: hCG, Beta Chain, Quant, S: <1 m[IU]/mL (ref <5)

## 2014-12-05 NOTE — ED Notes (Signed)
md came to room to talk to pt, pt not in room. Gown on bed. Staff waiting, pt did not return.

## 2014-12-05 NOTE — ED Notes (Signed)
Patient states she has had a large amount of vaginal bleeding x 30 minutes ago and having intermittent lower abdominal pain. Patient states she had 6 positive home pregnancy tests at home. Patient also states she inserted a tampon in 30 minutes ago as well. Patient states she has had a miscarriage in the past.

## 2014-12-05 NOTE — ED Provider Notes (Signed)
CSN: [id number]     Arrival date & time 12/05/14  1342 History   First MD Initiated Contact with Patient 12/05/14 1449     Chief Complaint  Patient presents with  . Vaginal Bleeding  . Abdominal Pain     (Consider location/radiation/quality/duration/timing/severity/associated sxs/prior Treatment) HPI  34 year old female presents with an acute enlargement of vaginal bleeding that started about one hour prior to arrival. Patient states she was in line and noticed sudden onset of pain and bleeding. She has not had her menstrual cycle in 2 months. Over the past couple weeks she has had 5 or 6 positive pregnancy test. Patient states that her tubes have been tied 5 years ago. Denies dysuria. No vaginal discharge or concern for STI. Has also been having multiple loose watery stools today. Denies nausea or vomiting. Patient's main area pain is midline pelvic. Also having low back pain. This pain is sharp and somewhat different from her typical menstrual cycles.  Past Medical History  Diagnosis Date  . Bronchitis    Past Surgical History  Procedure Laterality Date  . Tubal ligation     Family History  Problem Relation Age of Onset  . Family history unknown: Yes   History  Substance Use Topics  . Smoking status: Current Every Day Smoker -- 0.50 packs/day    Types: Cigarettes  . Smokeless tobacco: Never Used  . Alcohol Use: No   OB History    No data available     Review of Systems  Constitutional: Negative for fever.  Gastrointestinal: Positive for abdominal pain and diarrhea. Negative for vomiting.  Genitourinary: Positive for vaginal bleeding. Negative for dysuria and vaginal discharge.  Musculoskeletal: Positive for back pain.  All other systems reviewed and are negative.     Allergies  Review of patient's allergies indicates no known allergies.  Home Medications   Prior to Admission medications   Medication Sig Start Date End Date Taking? Authorizing Provider  Honey 5  GM/5ML SYRP Take 5-10 mLs by mouth daily as needed (for cough).   Yes Historical Provider, MD  Multiple Vitamins-Minerals (CENTRUM ADULTS) TABS Take 1 tablet by mouth daily.   Yes Historical Provider, MD  methocarbamol (ROBAXIN) 500 MG tablet Take 1 tablet (500 mg total) by mouth 2 (two) times daily. Patient not taking: Reported on 12/05/2014 10/29/14   Garlon Hatchet, PA-C  metroNIDAZOLE (FLAGYL) 500 MG tablet Take 1 tablet (500 mg total) by mouth 2 (two) times daily. Patient not taking: Reported on 12/05/2014 09/08/14   Tilden Fossa, MD  naproxen (NAPROSYN) 500 MG tablet Take 1 tablet (500 mg total) by mouth 2 (two) times daily with a meal. Patient not taking: Reported on 12/05/2014 10/29/14   Garlon Hatchet, PA-C  promethazine (PHENERGAN) 25 MG tablet Take 1 tablet (25 mg total) by mouth every 6 (six) hours as needed for nausea or vomiting. Patient not taking: Reported on 12/05/2014 09/08/14   Tilden Fossa, MD   BP 121/73 mmHg  Pulse 91  Temp(Src) 98.4 F (36.9 C) (Oral)  Resp 16  Ht  (1.626 m)  SpO2 97%  LMP 09/15/2014 Physical Exam  Constitutional: She is oriented to person, place, and time. She appears well-developed and well-nourished.  HENT:  Head: Normocephalic and atraumatic.  Right Ear: External ear normal.  Left Ear: External ear normal.  Nose: Nose normal.  Eyes: Right eye exhibits no discharge. Left eye exhibits no discharge.  Cardiovascular: Normal rate, regular rhythm and normal heart sounds.   Pulmonary/Chest:  Effort normal and breath sounds normal.  Abdominal: Soft. Normal appearance. She exhibits no distension. There is tenderness in the suprapubic area.  Genitourinary: Uterus is tender. Uterus is not deviated and not enlarged. Cervix exhibits no motion tenderness and no discharge. Right adnexum displays no mass and no tenderness. Left adnexum displays no mass and no tenderness. There is bleeding (mild amount of blood in vaginal vault) in the vagina.  Neurological:  She is alert and oriented to person, place, and time.  Skin: Skin is warm and dry.  Nursing note and vitals reviewed.   ED Course  Procedures (including critical care time) Labs Review Labs Reviewed  WET PREP, GENITAL - Abnormal; Notable for the following:    WBC, Wet Prep HPF POC RARE (*)    All other components within normal limits  CBC WITH DIFFERENTIAL/PLATELET - Abnormal; Notable for the following:    Hemoglobin 11.8 (*)    HCT 35.7 (*)    Monocytes Relative 14 (*)    All other components within normal limits  COMPREHENSIVE METABOLIC PANEL - Abnormal; Notable for the following:    Calcium 8.7 (*)    Total Protein 5.8 (*)    Albumin 3.4 (*)    AST 14 (*)    ALT 12 (*)    All other components within normal limits  LIPASE, BLOOD - Abnormal; Notable for the following:    Lipase 18 (*)    All other components within normal limits  URINALYSIS, ROUTINE W REFLEX MICROSCOPIC (NOT AT [facility])  HCG, QUANTITATIVE, PREGNANCY  POC URINE PREG, ED  ABO/RH  GC/CHLAMYDIA PROBE AMP ([place]) NOT AT [facility]    Imaging Review No results found.   EKG Interpretation None      MDM   Final diagnoses:  Vaginal bleeding    Patient with vaginal bleeding. She does not appear to be pregnant with a negative urine and blood pregnancy test. Has midline tenderness that would be expected with bleeding but it is not severe and I do not feel ultrasound or CT imaging would be beneficial. No lateralizing signs to suggest ovarian pathology. Hemoglobin is mildly low at 11.8 but not significantly changed. Patient apparently eloped prior to discussing final results.    Pricilla Loveless, MD 12/05/14 [phone]

## 2014-12-06 LAB — GC/CHLAMYDIA PROBE AMP (~~LOC~~) NOT AT ARMC
Chlamydia: NEGATIVE
NEISSERIA GONORRHEA: NEGATIVE

## 2014-12-13 ENCOUNTER — Emergency Department (HOSPITAL_COMMUNITY)
Admission: EM | Admit: 2014-12-13 | Discharge: 2014-12-14 | Disposition: A | Discharge status: Home / Self Care | Payer: Medicaid Other | Attending: Emergency Medicine | Admitting: Emergency Medicine

## 2014-12-13 ENCOUNTER — Encounter (HOSPITAL_COMMUNITY): Payer: Self-pay | Admitting: Emergency Medicine

## 2014-12-13 ENCOUNTER — Emergency Department (HOSPITAL_COMMUNITY): Payer: Medicaid Other

## 2014-12-13 DIAGNOSIS — Z720 Tobacco use: Secondary | ICD-10-CM | POA: Insufficient documentation

## 2014-12-13 DIAGNOSIS — Z8709 Personal history of other diseases of the respiratory system: Secondary | ICD-10-CM | POA: Diagnosis not present

## 2014-12-13 DIAGNOSIS — R0789 Other chest pain: Secondary | ICD-10-CM | POA: Diagnosis not present

## 2014-12-13 DIAGNOSIS — Z79899 Other long term (current) drug therapy: Secondary | ICD-10-CM | POA: Insufficient documentation

## 2014-12-13 DIAGNOSIS — R079 Chest pain, unspecified: Secondary | ICD-10-CM | POA: Diagnosis present

## 2014-12-13 LAB — CBC WITH DIFFERENTIAL/PLATELET
BASOS ABS: 0 10*3/uL (ref 0.0–0.1)
BASOS PCT: 1 % (ref 0–1)
Eosinophils Absolute: 0.1 10*3/uL (ref 0.0–0.7)
Eosinophils Relative: 1 % (ref 0–5)
HEMATOCRIT: 38.8 % (ref 36.0–46.0)
HEMOGLOBIN: 13.5 g/dL (ref 12.0–15.0)
LYMPHS ABS: 2.6 10*3/uL (ref 0.7–4.0)
LYMPHS PCT: 35 % (ref 12–46)
MCH: 27.4 pg (ref 26.0–34.0)
MCHC: 34.8 g/dL (ref 30.0–36.0)
MCV: 78.9 fL (ref 78.0–100.0)
Monocytes Absolute: 0.8 10*3/uL (ref 0.1–1.0)
Monocytes Relative: 11 % (ref 3–12)
NEUTROS PCT: 52 % (ref 43–77)
Neutro Abs: 3.9 10*3/uL (ref 1.7–7.7)
Platelets: 200 10*3/uL (ref 150–400)
RBC: 4.92 MIL/uL (ref 3.87–5.11)
RDW: 13.8 % (ref 11.5–15.5)
WBC: 7.4 10*3/uL (ref 4.0–10.5)

## 2014-12-13 LAB — COMPREHENSIVE METABOLIC PANEL
ALBUMIN: 3.3 g/dL — AB (ref 3.5–5.0)
ALT: 11 U/L — ABNORMAL LOW (ref 14–54)
ANION GAP: 7 (ref 5–15)
AST: 16 U/L (ref 15–41)
Alkaline Phosphatase: 36 U/L — ABNORMAL LOW (ref 38–126)
BILIRUBIN TOTAL: 0.7 mg/dL (ref 0.3–1.2)
BUN: 7 mg/dL (ref 6–20)
CO2: 22 mmol/L (ref 22–32)
CREATININE: 1.05 mg/dL — AB (ref 0.44–1.00)
Calcium: 8.6 mg/dL — ABNORMAL LOW (ref 8.9–10.3)
Chloride: 109 mmol/L (ref 101–111)
GFR calc Af Amer: >60 mL/min (ref >60)
GFR calc non Af Amer: >60 mL/min (ref >60)
Glucose, Bld: 115 mg/dL — ABNORMAL HIGH (ref 65–99)
Potassium: 3.2 mmol/L — ABNORMAL LOW (ref 3.5–5.1)
Sodium: 138 mmol/L (ref 135–145)
Total Protein: 5.4 g/dL — ABNORMAL LOW (ref 6.5–8.1)

## 2014-12-13 LAB — I-STAT TROPONIN, ED: Troponin i, poc: 0.01 ng/mL (ref 0.00–0.08)

## 2014-12-13 MED ORDER — PREDNISONE 20 MG PO TABS
60.0000 mg | ORAL_TABLET | ORAL | Status: AC
  Administered 2014-12-13: 60 mg via ORAL
  Filled 2014-12-13: qty 3

## 2014-12-13 MED ORDER — PREDNISONE 20 MG PO TABS: 60.0000 mg | ORAL_TABLET | Freq: Every day | ORAL | Status: AC

## 2014-12-13 MED ORDER — PREDNISONE 20 MG PO TABS: 60.0000 mg | ORAL_TABLET | Freq: Every day | ORAL | Status: DC

## 2014-12-13 NOTE — ED Notes (Signed)
Pt st's she has hx of bronchitis and now thinks she has pneumonia.  St's chest started hurting yesterday.  Productive cough (green)

## 2014-12-13 NOTE — Discharge Instructions (Signed)
As discussed, your evaluation today has been largely reassuring.  But, it is important that you monitor your condition carefully, and do not hesitate to return to the ED if you develop new, or concerning changes in your condition.  Your pain is likely due to inflammatory changes in the chest wall.  Smoking cessation, will also be beneficial In minimizing your symptoms.  Otherwise, please follow-up with your physician for appropriate ongoing care.  For the next 2 days, please be sure to use your albuterol inhaler every 4 hours in addition to the newly prescribed medication.

## 2014-12-14 NOTE — ED Provider Notes (Signed)
CSN: [id number]     Arrival date & time 12/13/14  1944 History   First MD Initiated Contact with Patient 12/13/14 2054     Chief Complaint  Patient presents with  . Chest Pain     (Consider location/radiation/quality/duration/timing/severity/associated sxs/prior Treatment) HPI Patient presents with concern of ongoing cough, chest pain, mild dyspnea. Symptoms began several days ago, since onset have been progressive, in spite of using home medication, broncho-dilator, OTC therapy. There is no associated fever, chills, headache, syncope, no exertional or pleuritic change. Patient continues to smoke.   Smoking cessation provided, particularly in light of this patient's evaluation in the ED.     Past Medical History  Diagnosis Date  . Bronchitis    Past Surgical History  Procedure Laterality Date  . Tubal ligation     Family History  Problem Relation Age of Onset  . Family history unknown: Yes   History  Substance Use Topics  . Smoking status: Current Every Day Smoker -- 0.50 packs/day    Types: Cigarettes  . Smokeless tobacco: Never Used  . Alcohol Use: No   OB History    No data available     Review of Systems  Constitutional:       Per HPI, otherwise negative  HENT:       Per HPI, otherwise negative  Respiratory:       Per HPI, otherwise negative  Cardiovascular:       Per HPI, otherwise negative  Gastrointestinal: Negative for vomiting.  Endocrine:       Negative aside from HPI  Genitourinary:       Neg aside from HPI   Musculoskeletal:       Per HPI, otherwise negative  Skin: Negative.   Neurological: Negative for syncope.      Allergies  Review of patient's allergies indicates no known allergies.  Home Medications   Prior to Admission medications   Medication Sig Start Date End Date Taking? Authorizing Provider  brompheniramine-pseudoephedrine-DM 30-2-10 MG/5ML syrup Take 5 mLs by mouth 4 (four) times daily as needed (cough, congestion).    Yes Historical Provider, MD  Honey 5 GM/5ML SYRP Take 5-10 mLs by mouth daily as needed (for cough).   Yes Historical Provider, MD  Multiple Vitamins-Minerals (CENTRUM ADULTS) TABS Take 1 tablet by mouth daily.   Yes Historical Provider, MD  methocarbamol (ROBAXIN) 500 MG tablet Take 1 tablet (500 mg total) by mouth 2 (two) times daily. Patient not taking: Reported on 12/05/2014 10/29/14   Garlon HatchetLisa M Sanders, PA-C  metroNIDAZOLE (FLAGYL) 500 MG tablet Take 1 tablet (500 mg total) by mouth 2 (two) times daily. Patient not taking: Reported on 12/05/2014 09/08/14   Tilden FossaElizabeth Rees, MD  naproxen (NAPROSYN) 500 MG tablet Take 1 tablet (500 mg total) by mouth 2 (two) times daily with a meal. Patient not taking: Reported on 12/05/2014 10/29/14   Garlon HatchetLisa M Sanders, PA-C  predniSONE (DELTASONE) 20 MG tablet Take 3 tablets (60 mg total) by mouth daily with breakfast. 12/14/14 12/17/14  Gerhard Munchobert Sonnie Pawloski, MD  promethazine (PHENERGAN) 25 MG tablet Take 1 tablet (25 mg total) by mouth every 6 (six) hours as needed for nausea or vomiting. Patient not taking: Reported on 12/05/2014 09/08/14   Tilden FossaElizabeth Rees, MD   BP 116/69 mmHg  Pulse 87  Temp(Src) 98.4 F (36.9 C) (Oral)  Resp 16  SpO2 93%  LMP 12/05/2014 Physical Exam  Constitutional: She is oriented to person, place, and time. She appears well-developed and well-nourished. No distress.  HENT:  Head: Normocephalic and atraumatic.  Eyes: Conjunctivae and EOM are normal.  Cardiovascular: Normal rate and regular rhythm.   Pulmonary/Chest: Effort normal and breath sounds normal. No stridor. No respiratory distress. She has no wheezes. She has no rales.  Abdominal: She exhibits no distension.  Musculoskeletal: She exhibits no edema.  Neurological: She is alert and oriented to person, place, and time. No cranial nerve deficit.  Skin: Skin is warm and dry.  Psychiatric: She has a normal mood and affect.  Nursing note and vitals reviewed.   ED Course  Procedures (including  critical care time) Labs Review Labs Reviewed  COMPREHENSIVE METABOLIC PANEL - Abnormal; Notable for the following:    Potassium 3.2 (*)    Glucose, Bld 115 (*)    Creatinine, Ser 1.05 (*)    Calcium 8.6 (*)    Total Protein 5.4 (*)    Albumin 3.3 (*)    ALT 11 (*)    Alkaline Phosphatase 36 (*)    All other components within normal limits  CBC WITH DIFFERENTIAL/PLATELET  Rosezena Sensor, ED    Imaging Review Dg Chest 2 View  12/13/2014   CLINICAL DATA:  Chest pain and shortness of breath.  EXAM: CHEST  2 VIEW  COMPARISON:  None.  FINDINGS: The heart size and mediastinal contours are within normal limits. Both lungs are clear. The visualized skeletal structures are unremarkable.  IMPRESSION: No active cardiopulmonary disease.   Electronically Signed   By: Signa Kell M.D.   On: 12/13/2014 20:21     EKG Interpretation   Date/Time:  Thursday December 13 2014 19:48:12 EDT Ventricular Rate:  105 PR Interval:  124 QRS Duration: 80 QT Interval:  330 QTC Calculation: 436 R Axis:   86 Text Interpretation:  Sinus tachycardia Right atrial enlargement  Nonspecific T wave abnormality Abnormal ECG Sinus tachycardia Artifact T  wave abnormality Abnormal ekg Confirmed by Gerhard Munch  MD ([id number]) on  12/13/2014 10:40:31 PM    O2- 96%ra, nml  Cardiac: 85 sr, nml      MDM   Final diagnoses:  Atypical chest pain    Young female, generally well female presents with ongoing cough, congestion, chest tightness associated with coughing. Patient does smoke, otherwise has minimal risk profile for ACS or PE. Patient's labs, vitals reassuring. Patient discharged in stable condition after initiation of broncho-dilator scheduled therapy, steroids.  Gerhard Munch, MD 12/14/14 [phone]

## 2015-02-05 ENCOUNTER — Emergency Department (HOSPITAL_COMMUNITY)
Admission: EM | Admit: 2015-02-05 | Discharge: 2015-02-05 | Disposition: A | Discharge status: Home / Self Care | Payer: Medicaid Other | Attending: Physician Assistant | Admitting: Physician Assistant

## 2015-02-05 ENCOUNTER — Encounter (HOSPITAL_COMMUNITY): Payer: Self-pay

## 2015-02-05 DIAGNOSIS — M549 Dorsalgia, unspecified: Secondary | ICD-10-CM | POA: Insufficient documentation

## 2015-02-05 DIAGNOSIS — Z9851 Tubal ligation status: Secondary | ICD-10-CM | POA: Insufficient documentation

## 2015-02-05 DIAGNOSIS — A5901 Trichomonal vulvovaginitis: Secondary | ICD-10-CM | POA: Diagnosis not present

## 2015-02-05 DIAGNOSIS — R1032 Left lower quadrant pain: Secondary | ICD-10-CM | POA: Diagnosis present

## 2015-02-05 DIAGNOSIS — Z3202 Encounter for pregnancy test, result negative: Secondary | ICD-10-CM | POA: Diagnosis not present

## 2015-02-05 DIAGNOSIS — R109 Unspecified abdominal pain: Secondary | ICD-10-CM

## 2015-02-05 DIAGNOSIS — R112 Nausea with vomiting, unspecified: Secondary | ICD-10-CM | POA: Insufficient documentation

## 2015-02-05 DIAGNOSIS — Z720 Tobacco use: Secondary | ICD-10-CM | POA: Insufficient documentation

## 2015-02-05 DIAGNOSIS — Z79899 Other long term (current) drug therapy: Secondary | ICD-10-CM | POA: Insufficient documentation

## 2015-02-05 DIAGNOSIS — Z8709 Personal history of other diseases of the respiratory system: Secondary | ICD-10-CM | POA: Diagnosis not present

## 2015-02-05 LAB — URINALYSIS, ROUTINE W REFLEX MICROSCOPIC
BILIRUBIN URINE: NEGATIVE
Glucose, UA: NEGATIVE mg/dL
Hgb urine dipstick: NEGATIVE
Ketones, ur: NEGATIVE mg/dL
NITRITE: NEGATIVE
Protein, ur: NEGATIVE mg/dL
SPECIFIC GRAVITY, URINE: 1.015 (ref 1.005–1.030)
UROBILINOGEN UA: 1 mg/dL (ref 0.0–1.0)
pH: 6 (ref 5.0–8.0)

## 2015-02-05 LAB — URINE MICROSCOPIC-ADD ON

## 2015-02-05 LAB — WET PREP, GENITAL: Yeast Wet Prep HPF POC: NONE SEEN

## 2015-02-05 LAB — POC URINE PREG, ED: PREG TEST UR: NEGATIVE

## 2015-02-05 MED ORDER — ACETAMINOPHEN 500 MG PO TABS
1000.0000 mg | ORAL_TABLET | Freq: Once | ORAL | Status: AC
  Administered 2015-02-05: 1000 mg via ORAL
  Filled 2015-02-05: qty 2

## 2015-02-05 MED ORDER — METRONIDAZOLE 500 MG PO TABS
2000.0000 mg | ORAL_TABLET | Freq: Once | ORAL | Status: AC
  Administered 2015-02-05: 2000 mg via ORAL
  Filled 2015-02-05: qty 4

## 2015-02-05 MED ORDER — CEFTRIAXONE SODIUM 250 MG IJ SOLR
250.0000 mg | Freq: Once | INTRAMUSCULAR | Status: AC
  Administered 2015-02-05: 250 mg via INTRAMUSCULAR
  Filled 2015-02-05: qty 250

## 2015-02-05 MED ORDER — STERILE WATER FOR INJECTION IJ SOLN
INTRAMUSCULAR | Status: AC
  Administered 2015-02-05: 10 mL
  Filled 2015-02-05: qty 10

## 2015-02-05 MED ORDER — AZITHROMYCIN 250 MG PO TABS
1000.0000 mg | ORAL_TABLET | Freq: Once | ORAL | Status: AC
  Administered 2015-02-05: 1000 mg via ORAL
  Filled 2015-02-05: qty 4

## 2015-02-05 NOTE — ED Notes (Signed)
Patient c/o left lower abdominal pain that is radiating into the left back. Patient also c/o urinary frequency, but denies vaginal discharge or dysuria.

## 2015-02-05 NOTE — Discharge Instructions (Signed)
No sexual intercourse for 10 days. You are obligated to inform your partner for treatment of trichomonas.  Trichomoniasis Trichomoniasis is an infection caused by an organism called Trichomonas. The infection can affect both women and men. In women, the outer female genitalia and the vagina are affected. In men, the penis is mainly affected, but the prostate and other reproductive organs can also be involved. Trichomoniasis is a sexually transmitted infection (STI) and is most often passed to another person through sexual contact.  RISK FACTORS  Having unprotected sexual intercourse.  Having sexual intercourse with an infected partner. SIGNS AND SYMPTOMS  Symptoms of trichomoniasis in women include:  Abnormal gray-green frothy vaginal discharge.  Itching and irritation of the vagina.  Itching and irritation of the area outside the vagina. Symptoms of trichomoniasis in men include:   Penile discharge with or without pain.  Pain during urination. This results from inflammation of the urethra. DIAGNOSIS  Trichomoniasis may be found during a Pap test or physical exam. Your health care provider may use one of the following methods to help diagnose this infection:  Examining vaginal discharge under a microscope. For men, urethral discharge would be examined.  Testing the pH of the vagina with a test tape.  Using a vaginal swab test that checks for the Trichomonas organism. A test is available that provides results within a few minutes.  Doing a culture test for the organism. This is not usually needed. TREATMENT   You may be given medicine to fight the infection. Women should inform their health care provider if they could be or are pregnant. Some medicines used to treat the infection should not be taken during pregnancy.  Your health care provider may recommend over-the-counter medicines or creams to decrease itching or irritation.  Your sexual partner will need to be treated if  infected. HOME CARE INSTRUCTIONS   Take medicines only as directed by your health care provider.  Take over-the-counter medicine for itching or irritation as directed by your health care provider.  Do not have sexual intercourse while you have the infection.  Women should not douche or wear tampons while they have the infection.  Discuss your infection with your partner. Your partner may have gotten the infection from you, or you may have gotten it from your partner.  Have your sex partner get examined and treated if necessary.  Practice safe, informed, and protected sex.  See your health care provider for other STI testing. SEEK MEDICAL CARE IF:   You still have symptoms after you finish your medicine.  You develop abdominal pain.  You have pain when you urinate.  You have bleeding after sexual intercourse.  You develop a rash.  Your medicine makes you sick or makes you throw up (vomit). MAKE SURE YOU:  Understand these instructions.  Will watch your condition.  Will get help right away if you are not doing well or get worse. Document Released: 11/25/2000 Document Revised: 10/16/2013 Document Reviewed: 03/13/2013 [facility] Patient Information 2015 [place], [place]. This information is not intended to replace advice given to you by your health care provider. Make sure you discuss any questions you have with your health care provider.  Abdominal Pain, Women Abdominal (stomach, pelvic, or belly) pain can be caused by many things. It is important to tell your doctor:  The location of the pain.  Does it come and go or is it present all the time?  Are there things that start the pain (eating certain foods, exercise)?  Are  there other symptoms associated with the pain (fever, nausea, vomiting, diarrhea)? All of this is helpful to know when trying to find the cause of the pain. CAUSES   Stomach: virus or bacteria infection, or ulcer.  Intestine: appendicitis (inflamed  appendix), regional ileitis (Crohn's disease), ulcerative colitis (inflamed colon), irritable bowel syndrome, diverticulitis (inflamed diverticulum of the colon), or cancer of the stomach or intestine.  Gallbladder disease or stones in the gallbladder.  Kidney disease, kidney stones, or infection.  Pancreas infection or cancer.  Fibromyalgia (pain disorder).  Diseases of the female organs:  Uterus: fibroid (non-cancerous) tumors or infection.  Fallopian tubes: infection or tubal pregnancy.  Ovary: cysts or tumors.  Pelvic adhesions (scar tissue).  Endometriosis (uterus lining tissue growing in the pelvis and on the pelvic organs).  Pelvic congestion syndrome (female organs filling up with blood just before the menstrual period).  Pain with the menstrual period.  Pain with ovulation (producing an egg).  Pain with an IUD (intrauterine device, birth control) in the uterus.  Cancer of the female organs.  Functional pain (pain not caused by a disease, may improve without treatment).  Psychological pain.  Depression. DIAGNOSIS  Your doctor will decide the seriousness of your pain by doing an examination.  Blood tests.  X-rays.  Ultrasound.  CT scan (computed tomography, special type of X-ray).  MRI (magnetic resonance imaging).  Cultures, for infection.  Barium enema (dye inserted in the large intestine, to better view it with X-rays).  Colonoscopy (looking in intestine with a lighted tube).  Laparoscopy (minor surgery, looking in abdomen with a lighted tube).  Major abdominal exploratory surgery (looking in abdomen with a large incision). TREATMENT  The treatment will depend on the cause of the pain.   Many cases can be observed and treated at home.  Over-the-counter medicines recommended by your caregiver.  Prescription medicine.  Antibiotics, for infection.  Birth control pills, for painful periods or for ovulation pain.  Hormone treatment, for  endometriosis.  Nerve blocking injections.  Physical therapy.  Antidepressants.  Counseling with a psychologist or psychiatrist.  Minor or major surgery. HOME CARE INSTRUCTIONS   Do not take laxatives, unless directed by your caregiver.  Take over-the-counter pain medicine only if ordered by your caregiver. Do not take aspirin because it can cause an upset stomach or bleeding.  Try a clear liquid diet (broth or water) as ordered by your caregiver. Slowly move to a bland diet, as tolerated, if the pain is related to the stomach or intestine.  Have a thermometer and take your temperature several times a day, and record it.  Bed rest and sleep, if it helps the pain.  Avoid sexual intercourse, if it causes pain.  Avoid stressful situations.  Keep your follow-up appointments and tests, as your caregiver orders.  If the pain does not go away with medicine or surgery, you may try:  Acupuncture.  Relaxation exercises (yoga, meditation).  Group therapy.  Counseling. SEEK MEDICAL CARE IF:   You notice certain foods cause stomach pain.  Your home care treatment is not helping your pain.  You need stronger pain medicine.  You want your IUD removed.  You feel faint or lightheaded.  You develop nausea and vomiting.  You develop a rash.  You are having side effects or an allergy to your medicine. SEEK IMMEDIATE MEDICAL CARE IF:   Your pain does not go away or gets worse.  You have a fever.  Your pain is felt only in portions of  the abdomen. The right side could possibly be appendicitis. The left lower portion of the abdomen could be colitis or diverticulitis.  You are passing blood in your stools (bright red or black tarry stools, with or without vomiting).  You have blood in your urine.  You develop chills, with or without a fever.  You pass out. MAKE SURE YOU:   Understand these instructions.  Will watch your condition.  Will get help right away if you  are not doing well or get worse. Document Released: 03/29/2007 Document Revised: 10/16/2013 Document Reviewed: 04/18/2009 [facility] Patient Information 2015 [place], [place]. This information is not intended to replace advice given to you by your health care provider. Make sure you discuss any questions you have with your health care provider.

## 2015-02-05 NOTE — ED Provider Notes (Signed)
CSN: [id number]     Arrival date & time 02/05/15  1747 History   First MD Initiated Contact with Patient 02/05/15 1816     Chief Complaint  Patient presents with  . Abdominal Pain     (Consider location/radiation/quality/duration/timing/severity/associated sxs/prior Treatment) HPI Comments: 34 year old female complaining of gradual onset left lower abdominal pain radiating to the left side of her back 2 days. Pain is sharp and tender in nature, worse when laying on her left side. No alleviating factors tried. Admits to associated increased urinary frequency without dysuria or hematuria. LMP 02/01/2015. Denies vaginal bleeding or discharge. Admits to occasional mild nausea and had one episode of nonbloody, nonbilious emesis yesterday. No fevers or diarrhea.  Patient is a 34 y.o. female presenting with abdominal pain. The history is provided by the patient.  Abdominal Pain Associated symptoms: nausea (none today) and vomiting (1 episode yesterday, none today)     Past Medical History  Diagnosis Date  . Bronchitis    Past Surgical History  Procedure Laterality Date  . Tubal ligation     Family History  Problem Relation Age of Onset  . Family history unknown: Yes   Social History  Substance Use Topics  . Smoking status: Current Every Day Smoker -- 0.50 packs/day    Types: Cigarettes  . Smokeless tobacco: Never Used  . Alcohol Use: No   OB History    No data available     Review of Systems  Gastrointestinal: Positive for nausea (none today), vomiting (1 episode yesterday, none today) and abdominal pain.  Musculoskeletal: Positive for back pain.  All other systems reviewed and are negative.     Allergies  Review of patient's allergies indicates no known allergies.  Home Medications   Prior to Admission medications   Medication Sig Start Date End Date Taking? Authorizing Provider  Multiple Vitamins-Minerals (CENTRUM ADULTS) TABS Take 1 tablet by mouth daily.   Yes  Historical Provider, MD  methocarbamol (ROBAXIN) 500 MG tablet Take 1 tablet (500 mg total) by mouth 2 (two) times daily. Patient not taking: Reported on 12/05/2014 10/29/14   Garlon Hatchet, PA-C  metroNIDAZOLE (FLAGYL) 500 MG tablet Take 1 tablet (500 mg total) by mouth 2 (two) times daily. Patient not taking: Reported on 12/05/2014 09/08/14   Tilden Fossa, MD  naproxen (NAPROSYN) 500 MG tablet Take 1 tablet (500 mg total) by mouth 2 (two) times daily with a meal. Patient not taking: Reported on 12/05/2014 10/29/14   Garlon Hatchet, PA-C  promethazine (PHENERGAN) 25 MG tablet Take 1 tablet (25 mg total) by mouth every 6 (six) hours as needed for nausea or vomiting. Patient not taking: Reported on 12/05/2014 09/08/14   Tilden Fossa, MD   BP 109/60 mmHg  Pulse 82  Temp(Src) 98.3 F (36.8 C) (Oral)  SpO2 100%  LMP 01/22/2015 Physical Exam  Constitutional: She is oriented to person, place, and time. She appears well-developed and well-nourished. No distress.  HENT:  Head: Normocephalic and atraumatic.  Mouth/Throat: Oropharynx is clear and moist.  Eyes: Conjunctivae and EOM are normal.  Neck: Normal range of motion. Neck supple.  Cardiovascular: Normal rate, regular rhythm and normal heart sounds.   Pulmonary/Chest: Effort normal and breath sounds normal. No respiratory distress.  Abdominal: Normal appearance. She exhibits no distension.  Mild left upper and lower tenderness. No rigidity, guarding or rebound. No peritoneal signs. No CVAT.  Genitourinary: Uterus normal. Cervix exhibits no motion tenderness, no discharge and no friability. Right adnexum displays no mass,  no tenderness and no fullness. Left adnexum displays no mass, no tenderness and no fullness. No tenderness or bleeding in the vagina. Vaginal discharge found.  Musculoskeletal: Normal range of motion. She exhibits no edema.  Neurological: She is alert and oriented to person, place, and time. No sensory deficit.  Skin: Skin is  warm and dry.  Psychiatric: She has a normal mood and affect. Her behavior is normal.  Nursing note and vitals reviewed.   ED Course  Procedures (including critical care time) Labs Review Labs Reviewed  WET PREP, GENITAL - Abnormal; Notable for the following:    Trich, Wet Prep MANY (*)    Clue Cells Wet Prep HPF POC MANY (*)    WBC, Wet Prep HPF POC RARE (*)    All other components within normal limits  URINALYSIS, ROUTINE W REFLEX MICROSCOPIC (NOT AT [facility]) - Abnormal; Notable for the following:    APPearance CLOUDY (*)    Leukocytes, UA SMALL (*)    All other components within normal limits  URINE MICROSCOPIC-ADD ON - Abnormal; Notable for the following:    Squamous Epithelial / LPF MANY (*)    All other components within normal limits  POC URINE PREG, ED  GC/CHLAMYDIA PROBE AMP ([place]) NOT AT [facility]    Imaging Review No results found. I have personally reviewed and evaluated these images and lab results as part of my medical decision-making.   EKG Interpretation None      MDM   Final diagnoses:  Trichomonas vaginitis  Left sided abdominal pain   Nontoxic appearing, NAD. AF VSS. Abdomen is soft with no peritoneal signs. Wet prep significant for many trichomonas. GC/Chlamydia cultures pending. No CMT or adnexal tenderness. Doubt PID. Doubt ovarian conversion, colitis, diverticulitis, renal stone. Rocephin, azithromycin and Flagyl given in ED. Safe sexual practices discussed. Follow-up with PCP. Stable for discharge. Return precautions given. Patient states understanding of treatment care plan and is agreeable.  Kathrynn Speed, PA-C 02/05/15 1933  Courteney Randall An, MD 02/05/15 [phone]

## 2015-02-07 LAB — GC/CHLAMYDIA PROBE AMP (~~LOC~~) NOT AT ARMC
Chlamydia: NEGATIVE
Neisseria Gonorrhea: NEGATIVE

## 2015-08-13 IMAGING — CR DG CHEST 2V
2 series · 2 of 2 positions shown · non-contrast
Comparison: 01/25/2006

CLINICAL DATA: Right-sided chest pain for 3 days, gradually
worsening, pain radiating to back, smoker

EXAM:
CHEST  2 VIEW

[w chest pa]
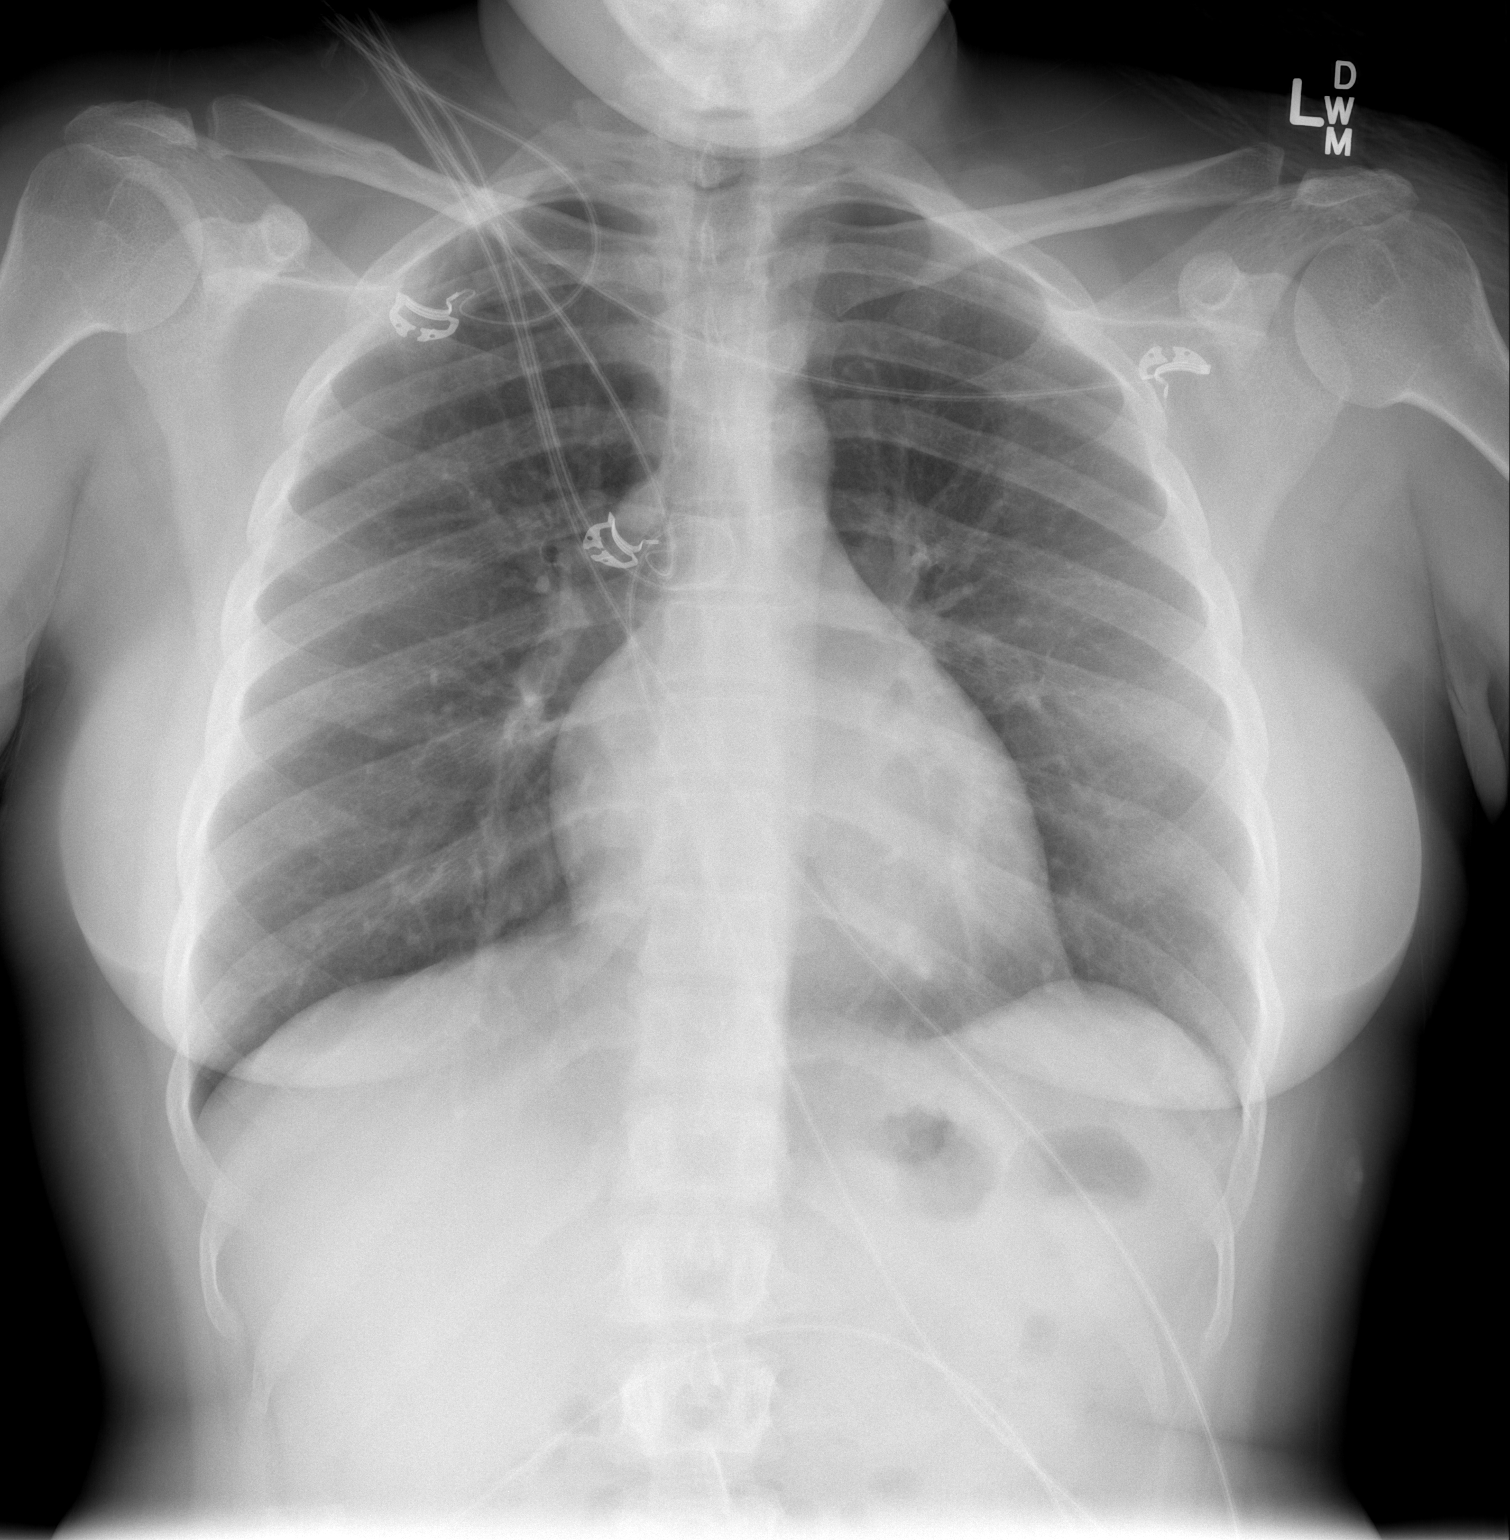

[w chest lat]
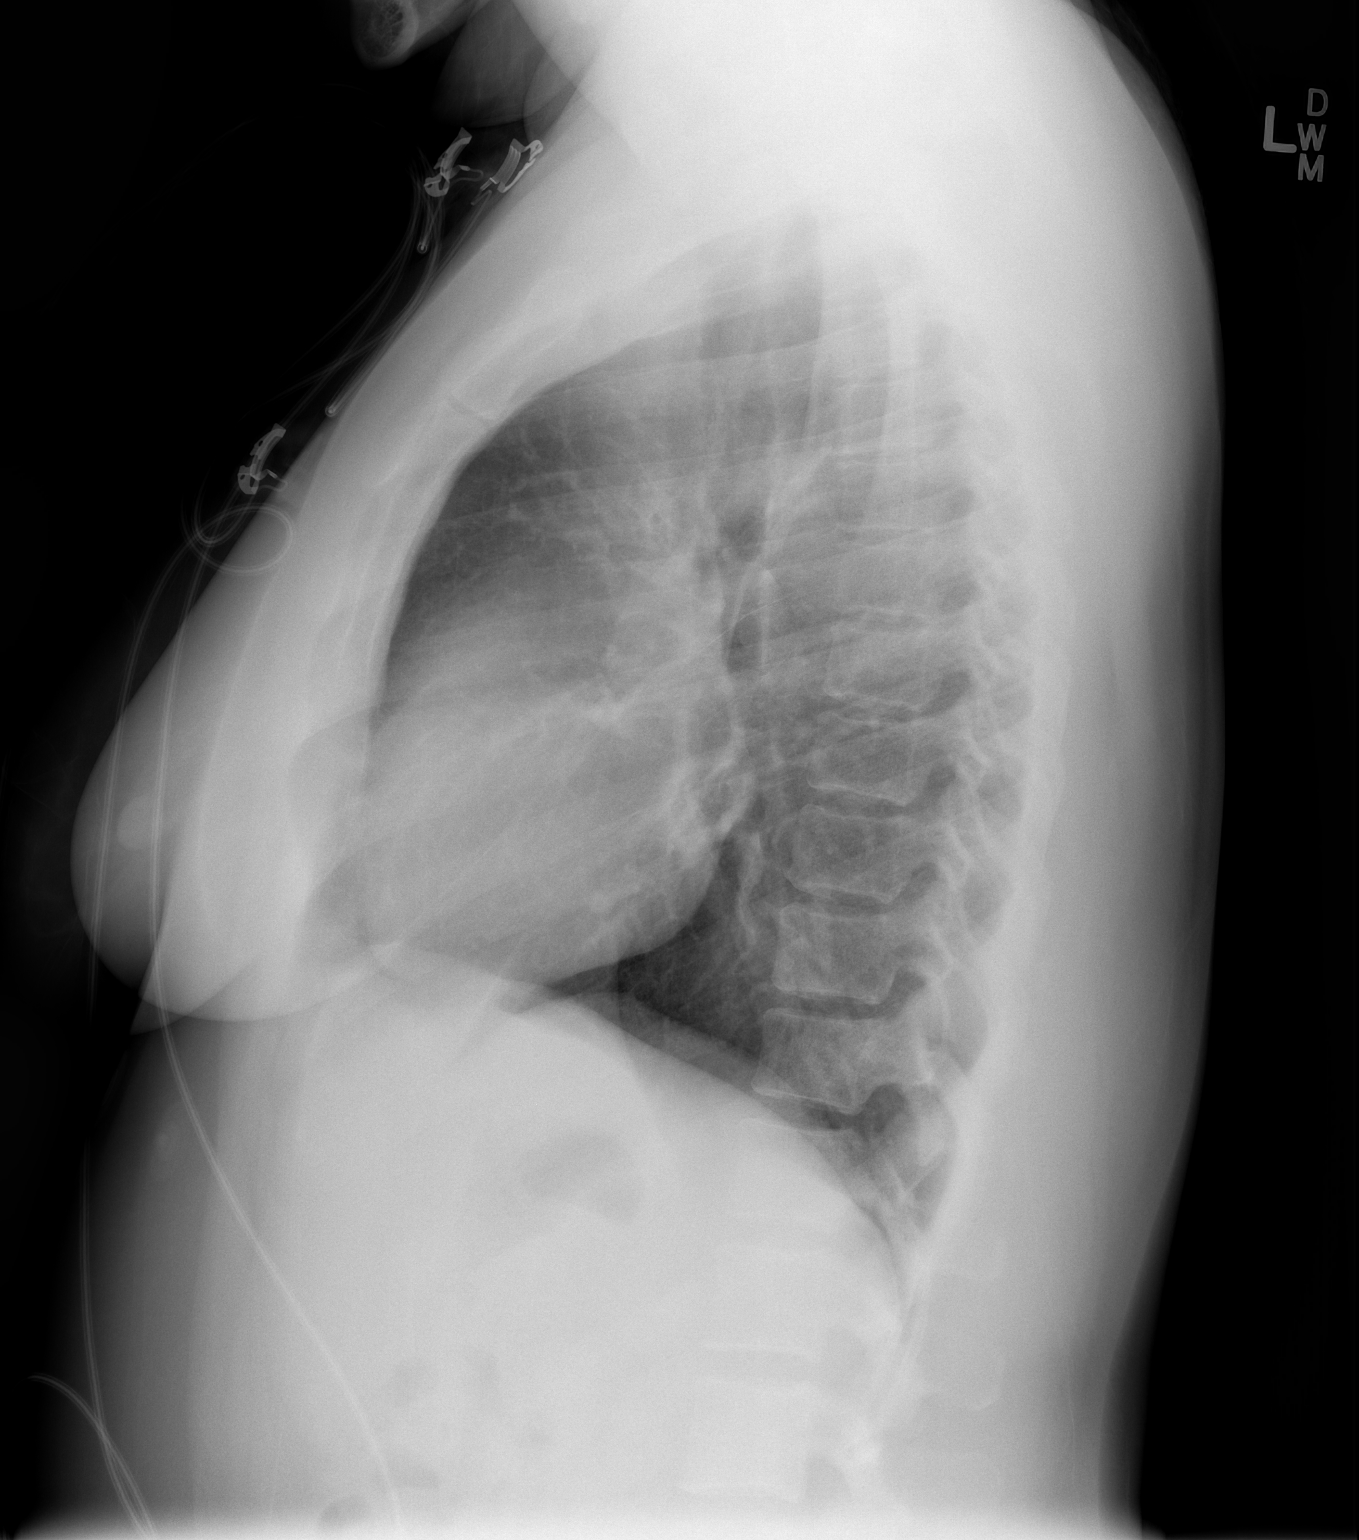

[2 of 2 positions shown; findings below may reference images not displayed]

FINDINGS: Normal heart size, mediastinal contours, and pulmonary vascularity.

Lungs clear.

Minimal chronic peribronchial thickening, slightly improved.

No pleural effusion or pneumothorax.

Bones unremarkable.
IMPRESSION: No acute abnormalities.

## 2015-10-13 ENCOUNTER — Emergency Department (HOSPITAL_COMMUNITY)
Admission: EM | Admit: 2015-10-13 | Discharge: 2015-10-13 | Disposition: A | Discharge status: Home / Self Care | Payer: Medicaid Other | Attending: Emergency Medicine | Admitting: Emergency Medicine

## 2015-10-13 ENCOUNTER — Emergency Department (HOSPITAL_COMMUNITY): Payer: Medicaid Other

## 2015-10-13 ENCOUNTER — Encounter (HOSPITAL_COMMUNITY): Payer: Self-pay | Admitting: Emergency Medicine

## 2015-10-13 DIAGNOSIS — Z79899 Other long term (current) drug therapy: Secondary | ICD-10-CM | POA: Diagnosis not present

## 2015-10-13 DIAGNOSIS — G629 Polyneuropathy, unspecified: Secondary | ICD-10-CM

## 2015-10-13 DIAGNOSIS — F17210 Nicotine dependence, cigarettes, uncomplicated: Secondary | ICD-10-CM | POA: Diagnosis not present

## 2015-10-13 DIAGNOSIS — R202 Paresthesia of skin: Secondary | ICD-10-CM

## 2015-10-13 DIAGNOSIS — M79671 Pain in right foot: Secondary | ICD-10-CM | POA: Diagnosis present

## 2015-10-13 LAB — CBG MONITORING, ED: Glucose-Capillary: 97 mg/dL (ref 65–99)

## 2015-10-13 MED ORDER — GABAPENTIN 300 MG PO CAPS: 300.0000 mg | ORAL_CAPSULE | Freq: Two times a day (BID) | ORAL | Status: DC

## 2015-10-13 NOTE — ED Provider Notes (Signed)
CSN: [id number]     Arrival date & time 10/13/15  0344 History   First MD Initiated Contact with Patient 10/13/15 [phone]     Chief Complaint  Patient presents with  . Foot Pain    right, states is is numb and swollen     (Consider location/radiation/quality/duration/timing/severity/associated sxs/prior Treatment) HPI Comments: Patient reports that she has been experiencing swelling and numbness of her right foot for 3 days. She denies direct injury. She does report that there is a family history of diabetes, is concerned that this might be the cause. There are no symptoms above the ankle. She is complaining mainly of numbness across the dorsal aspect of the toes and distal foot. She has not noticed any drainage, redness or skin change. She denies pain.  Patient is a 35 y.o. female presenting with lower extremity pain.  Foot Pain    Past Medical History  Diagnosis Date  . Bronchitis    Past Surgical History  Procedure Laterality Date  . Tubal ligation     Family History  Problem Relation Age of Onset  . Family history unknown: Yes   Social History  Substance Use Topics  . Smoking status: Current Every Day Smoker -- 0.25 packs/day    Types: Cigarettes  . Smokeless tobacco: Never Used  . Alcohol Use: No     Comment: occ   OB History    No data available     Review of Systems  Neurological: Positive for numbness.  All other systems reviewed and are negative.     Allergies  Review of patient's allergies indicates no known allergies.  Home Medications   Prior to Admission medications   Medication Sig Start Date End Date Taking? Authorizing Provider  Multiple Vitamins-Minerals (CENTRUM ADULTS) TABS Take 1 tablet by mouth daily.    Historical Provider, MD   BP 124/77 mmHg  Pulse 89  Temp(Src) 98.1 F (36.7 C) (Oral)  Resp 20  Ht 5\' 4"  (1.626 m)  Wt 160 lb 6.4 oz (72.757 kg)  BMI 27.52 kg/m2  SpO2 94%  LMP 10/10/2015 (Exact Date) Physical Exam   Constitutional: She is oriented to person, place, and time. She appears well-developed and well-nourished. No distress.  HENT:  Head: Normocephalic and atraumatic.  Right Ear: Hearing normal.  Left Ear: Hearing normal.  Nose: Nose normal.  Mouth/Throat: Oropharynx is clear and moist and mucous membranes are normal.  Eyes: Conjunctivae and EOM are normal. Pupils are equal, round, and reactive to light.  Neck: Normal range of motion. Neck supple.  Cardiovascular: Regular rhythm, S1 normal and S2 normal.  Exam reveals no gallop and no friction rub.   No murmur heard. Pulses:      Dorsalis pedis pulses are 2+ on the right side.  Pulmonary/Chest: Effort normal and breath sounds normal. No respiratory distress. She exhibits no tenderness.  Abdominal: Soft. Normal appearance and bowel sounds are normal. There is no hepatosplenomegaly. There is no tenderness. There is no rebound, no guarding, no tenderness at McBurney's point and negative Murphy's sign. No hernia.  Musculoskeletal: Normal range of motion.       Right lower leg: She exhibits no tenderness and no swelling.  Neurological: She is alert and oriented to person, place, and time. She has normal strength. No cranial nerve deficit or sensory deficit. Coordination normal. GCS eye subscore is 4. GCS verbal subscore is 5. GCS motor subscore is 6.  Skin: Skin is warm, dry and intact. No rash noted. No cyanosis.  Psychiatric: She has a normal mood and affect. Her speech is normal and behavior is normal. Thought content normal.  Nursing note and vitals reviewed.   ED Course  Procedures (including critical care time) Labs Review Labs Reviewed  CBG MONITORING, ED    Imaging Review Dg Foot Complete Right  10/13/2015  CLINICAL DATA:  Right foot pain and numbness for 3 days. EXAM: RIGHT FOOT COMPLETE - 3+ VIEW COMPARISON:  None. FINDINGS: There is no evidence of fracture or dislocation. There is no evidence of arthropathy or other focal bone  abnormality. Soft tissues are unremarkable. IMPRESSION: Negative. Electronically Signed   By: Marnee Spring M.D.   On: 10/13/2015 06:11   I have personally reviewed and evaluated these images and lab results as part of my medical decision-making.   EKG Interpretation None      MDM   Final diagnoses:  None  parasthesia Neuropathy   Patient presents to the emergency department for evaluation of numbness and swelling of her right foot. She denies injury. Examination reveals normal capillary refill of the toes. She has normal range of motion. Sensation is intact. No overlying signs of infection. Dorsalis pedal pulses palpable, no concern for arterial insufficiency. She does not have any calf swelling or concern for DVT. X-ray was unremarkable. Patient concerned about the possibility of diabetes, blood sugar was normal. Vital signs are all normal. I do not believe she requires any further workup. As the symptoms are isolated to the distal portion of the foot and toes, no concern for stroke or CNS involvement. This is likely a peripheral neuropathy that will improve on its own, does not require any further intervention.    Gilda Crease, MD 10/13/15 [phone]

## 2015-10-13 NOTE — Discharge Instructions (Signed)
Paresthesia Paresthesia is an abnormal burning or prickling sensation. This sensation is generally felt in the hands, arms, legs, or feet. However, it may occur in any part of the body. Usually, it is not painful. The feeling may be described as:  Tingling or numbness.  Pins and needles.  Skin crawling.  Buzzing.  Limbs falling asleep.  Itching. Most people experience temporary (transient) paresthesia at some time in their lives. Paresthesia may occur when you breathe too quickly (hyperventilation). It can also occur without any apparent cause. Commonly, paresthesia occurs when pressure is placed on a nerve. The sensation quickly goes away after the pressure is removed. For some people, however, paresthesia is a long-lasting (chronic) condition that is caused by an underlying disorder. If you continue to have paresthesia, you may need further medical evaluation. HOME CARE INSTRUCTIONS Watch your condition for any changes. Taking the following actions may help to lessen any discomfort that you are feeling:  Avoid drinking alcohol.  Try acupuncture or massage to help relieve your symptoms.  Keep all follow-up visits as directed by your health care provider. This is important. SEEK MEDICAL CARE IF:  You continue to have episodes of paresthesia.  Your burning or prickling feeling gets worse when you walk.  You have pain, cramps, or dizziness.  You develop a rash. SEEK IMMEDIATE MEDICAL CARE IF:  You feel weak.  You have trouble walking or moving.  You have problems with speech, understanding, or vision.  You feel confused.  You cannot control your bladder or bowel movements.  You have numbness after an injury.  You faint.   This information is not intended to replace advice given to you by your health care provider. Make sure you discuss any questions you have with your health care provider.   Document Released: 05/22/2002 Document Revised: 10/16/2014 Document Reviewed:  05/28/2014 Elsevier Interactive Patient Education 2016 [employer]  

## 2015-10-13 NOTE — ED Notes (Signed)
Patient states she has right foot numbness and swelling x 2-3 days. Patient denies sensation to the foot, when applying pressure to the toes patient states "ouch" but then says she can't feel it. Patient denies diabetes, reports a family hx. Patient states in the past she has had problems with her hands and fingers going numb.

## 2015-10-13 NOTE — ED Notes (Signed)
Pt reports understanding of discharge information. No questions at time of discharge 

## 2015-10-13 NOTE — ED Notes (Signed)
Patient transported to X-ray ambulatory without difficulty

## 2015-11-19 ENCOUNTER — Ambulatory Visit: Payer: Self-pay | Admitting: Podiatry

## 2015-11-26 ENCOUNTER — Ambulatory Visit: Payer: Medicaid Other | Admitting: Podiatry

## 2015-12-23 ENCOUNTER — Emergency Department (HOSPITAL_COMMUNITY)
Admission: EM | Admit: 2015-12-23 | Discharge: 2015-12-23 | Disposition: A | Discharge status: Home / Self Care | Payer: Medicaid Other | Attending: Emergency Medicine | Admitting: Emergency Medicine

## 2015-12-23 ENCOUNTER — Emergency Department (HOSPITAL_COMMUNITY): Payer: Medicaid Other

## 2015-12-23 DIAGNOSIS — M25512 Pain in left shoulder: Secondary | ICD-10-CM

## 2015-12-23 DIAGNOSIS — M545 Low back pain, unspecified: Secondary | ICD-10-CM

## 2015-12-23 DIAGNOSIS — F17210 Nicotine dependence, cigarettes, uncomplicated: Secondary | ICD-10-CM | POA: Diagnosis not present

## 2015-12-23 MED ORDER — NAPROXEN 500 MG PO TABS: 500.0000 mg | ORAL_TABLET | Freq: Two times a day (BID) | ORAL | Status: DC

## 2015-12-23 MED ORDER — KETOROLAC TROMETHAMINE 60 MG/2ML IM SOLN
60.0000 mg | Freq: Once | INTRAMUSCULAR | Status: AC
  Administered 2015-12-23: 60 mg via INTRAMUSCULAR
  Filled 2015-12-23: qty 2
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
## 2015-12-23 NOTE — Discharge Instructions (Signed)
Joint Pain Joint pain, which is also called arthralgia, can be caused by many things. Joint pain often goes away when you follow your health care provider's instructions for relieving pain at home. However, joint pain can also be caused by conditions that require further treatment. Common causes of joint pain include:  Bruising in the area of the joint.  Overuse of the joint.  Wear and tear on the joints that occur with aging (osteoarthritis).  Various other forms of arthritis.  A buildup of a crystal form of uric acid in the joint (gout).  Infections of the joint (septic arthritis) or of the bone (osteomyelitis). Your health care provider may recommend medicine to help with the pain. If your joint pain continues, additional tests may be needed to diagnose your condition. HOME CARE INSTRUCTIONS Watch your condition for any changes. Follow these instructions as directed to lessen the pain that you are feeling.  Take medicines only as directed by your health care provider.  Rest the affected area for as long as your health care provider says that you should. If directed to do so, raise the painful joint above the level of your heart while you are sitting or lying down.  Do not do things that cause or worsen pain.  If directed, apply ice to the painful area:  Put ice in a plastic bag.  Place a towel between your skin and the bag.  Leave the ice on for 20 minutes, 2-3 times per day.  Wear an elastic bandage, splint, or sling as directed by your health care provider. Loosen the elastic bandage or splint if your fingers or toes become numb and tingle, or if they turn cold and blue.  Begin exercising or stretching the affected area as directed by your health care provider. Ask your health care provider what types of exercise are safe for you.  Keep all follow-up visits as directed by your health care provider. This is important. SEEK MEDICAL CARE IF:  Your pain increases, and medicine  does not help.  Your joint pain does not improve within 3 days.  You have increased bruising or swelling.  You have a fever.  You lose 10 lb (4.5 kg) or more without trying. SEEK IMMEDIATE MEDICAL CARE IF:  You are not able to move the joint.  Your fingers or toes become numb or they turn cold and blue.   This information is not intended to replace advice given to you by your health care provider. Make sure you discuss any questions you have with your health care provider.   Document Released: 06/01/2005 Document Revised: 06/22/2014 Document Reviewed: 03/13/2014 Elsevier Interactive Patient Education 2016 [employer]  Back Pain, Adult Back pain is very common in adults.The cause of back pain is rarely dangerous and the pain often gets better over time.The cause of your back pain may not be known. Some common causes of back pain include:  Strain of the muscles or ligaments supporting the spine.  Wear and tear (degeneration) of the spinal disks.  Arthritis.  Direct injury to the back. For many people, back pain may return. Since back pain is rarely dangerous, most people can learn to manage this condition on their own. HOME CARE INSTRUCTIONS Watch your back pain for any changes. The following actions may help to lessen any discomfort you are feeling:  Remain active. It is stressful on your back to sit or stand in one place for long periods of time. Do not sit, drive, or stand  in one place for more than 30 minutes at a time. Take short walks on even surfaces as soon as you are able.Try to increase the length of time you walk each day.  Exercise regularly as directed by your health care provider. Exercise helps your back heal faster. It also helps avoid future injury by keeping your muscles strong and flexible.  Do not stay in bed.Resting more than 1-2 days can delay your recovery.  Pay attention to your body when you bend and lift. The most comfortable positions are those  that put less stress on your recovering back. Always use proper lifting techniques, including:  Bending your knees.  Keeping the load close to your body.  Avoiding twisting.  Find a comfortable position to sleep. Use a firm mattress and lie on your side with your knees slightly bent. If you lie on your back, put a pillow under your knees.  Avoid feeling anxious or stressed.Stress increases muscle tension and can worsen back pain.It is important to recognize when you are anxious or stressed and learn ways to manage it, such as with exercise.  Take medicines only as directed by your health care provider. Over-the-counter medicines to reduce pain and inflammation are often the most helpful.Your health care provider may prescribe muscle relaxant drugs.These medicines help dull your pain so you can more quickly return to your normal activities and healthy exercise.  Apply ice to the injured area:  Put ice in a plastic bag.  Place a towel between your skin and the bag.  Leave the ice on for 20 minutes, 2-3 times a day for the first 2-3 days. After that, ice and heat may be alternated to reduce pain and spasms.  Maintain a healthy weight. Excess weight puts extra stress on your back and makes it difficult to maintain good posture. SEEK MEDICAL CARE IF:  You have pain that is not relieved with rest or medicine.  You have increasing pain going down into the legs or buttocks.  You have pain that does not improve in one week.  You have night pain.  You lose weight.  You have a fever or chills. SEEK IMMEDIATE MEDICAL CARE IF:   You develop new bowel or bladder control problems.  You have unusual weakness or numbness in your arms or legs.  You develop nausea or vomiting.  You develop abdominal pain.  You feel faint.   This information is not intended to replace advice given to you by your health care provider. Make sure you discuss any questions you have with your health care  provider.   Document Released: 06/01/2005 Document Revised: 06/22/2014 Document Reviewed: 10/03/2013 Elsevier Interactive Patient Education [employer]
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
## 2015-12-23 NOTE — ED Notes (Signed)
Bed: WA27 Expected date:  Expected time:  Means of arrival:  Comments: 

## 2015-12-23 NOTE — ED Notes (Signed)
Pt in xray

## 2016-01-20 ENCOUNTER — Ambulatory Visit (HOSPITAL_COMMUNITY)
Admission: EM | Admit: 2016-01-20 | Discharge: 2016-01-20 | Disposition: A | Discharge status: Home / Self Care | Payer: Medicaid Other | Attending: Family Medicine | Admitting: Family Medicine

## 2016-01-20 ENCOUNTER — Encounter (HOSPITAL_COMMUNITY): Payer: Self-pay | Admitting: Emergency Medicine

## 2016-01-20 DIAGNOSIS — Z8619 Personal history of other infectious and parasitic diseases: Secondary | ICD-10-CM | POA: Diagnosis not present

## 2016-01-20 DIAGNOSIS — F17210 Nicotine dependence, cigarettes, uncomplicated: Secondary | ICD-10-CM | POA: Diagnosis not present

## 2016-01-20 DIAGNOSIS — L259 Unspecified contact dermatitis, unspecified cause: Secondary | ICD-10-CM | POA: Diagnosis not present

## 2016-01-20 DIAGNOSIS — Z113 Encounter for screening for infections with a predominantly sexual mode of transmission: Secondary | ICD-10-CM | POA: Insufficient documentation

## 2016-01-20 DIAGNOSIS — L230 Allergic contact dermatitis due to metals: Secondary | ICD-10-CM | POA: Diagnosis not present

## 2016-01-20 DIAGNOSIS — L258 Unspecified contact dermatitis due to other agents: Secondary | ICD-10-CM

## 2016-01-20 MED ORDER — BETAMETHASONE VALERATE 0.1 % EX LOTN: 1.0000 "application " | TOPICAL_LOTION | Freq: Two times a day (BID) | CUTANEOUS | 0 refills | Status: DC

## 2016-01-20 NOTE — Discharge Instructions (Signed)
Stop wearing the necklaces and see if the symptoms improve. You can also apply betamethasone lotion as needed. If symptoms continue, please follow up with your PCP or urgent care to discuss further treatment options.   You will be contacted for any abnormal test result. Douching increases your risk of bacterial vaginosis, so it is not recommended.

## 2016-01-20 NOTE — ED Provider Notes (Signed)
[facility]    CSN: [id number] Arrival date & time: 01/20/16  1700  First Provider Contact:  First MD Initiated Contact with Patient 01/20/16 1831    History   Chief Complaint No chief complaint on file.   HPI Becky Pham is a 35 y.o. female presenting for STI screening prior to sexual activity with new boyfriend who is also being tested. She reports normal vaginal discharge without irritation or bleeding. No symptoms in partner. Has a history of trichomonas and chlamydia in the remote past treated.   Also reports itchy rash around her neck x 1 month. She's been wearing necklaces more often as is her boyfriend's preference. Has tried topical OTC treatment for ringworm with no resolution. No wounds or fevers.   HPI  Past Medical History:  Diagnosis Date  . Bronchitis     There are no active problems to display for this patient.   Past Surgical History:  Procedure Laterality Date  . TUBAL LIGATION      OB History    No data available       Home Medications    Prior to Admission medications   Medication Sig Start Date End Date Taking? Authorizing Provider  betamethasone valerate lotion (VALISONE) 0.1 % Apply 1 application topically 2 (two) times daily. 01/20/16   Tyrone Nineyan B Neeko Pharo, MD  gabapentin (NEURONTIN) 300 MG capsule Take 1 capsule (300 mg total) by mouth 2 (two) times daily. 10/13/15   Gilda Creasehristopher J Pollina, MD  Multiple Vitamins-Minerals (CENTRUM ADULTS) TABS Take 1 tablet by mouth daily.    Historical Provider, MD  naproxen (NAPROSYN) 500 MG tablet Take 1 tablet (500 mg total) by mouth 2 (two) times daily. 12/23/15   Rolm GalaStevi Barrett, PA-C    Family History Family History  Problem Relation Age of Onset  . Family history unknown: Yes    Social History Social History  Substance Use Topics  . Smoking status: Current Every Day Smoker    Packs/day: 0.25    Types: Cigarettes  . Smokeless tobacco: Never Used  . Alcohol use No     Comment: occ      Allergies   Review of patient's allergies indicates no known allergies.   Review of Systems Review of Systems As above  Physical Exam Triage Vital Signs ED Triage Vitals [01/20/16 1807]  Enc Vitals Group     BP 116/60     Pulse Rate 88     Resp 16     Temp 98.9 F (37.2 C)     Temp Source Oral     SpO2 100 %     Weight      Height      Head Circumference      Peak Flow      Pain Score      Pain Loc      Pain Edu?      Excl. in GC?    No data found.   Updated Vital Signs BP 116/60 (BP Location: Left Arm)   Pulse 88   Temp 98.9 F (37.2 C) (Oral)   Resp 16   LMP 01/01/2016   SpO2 100%   Physical Exam  Constitutional: She is oriented to person, place, and time. She appears well-developed and well-nourished. No distress.  Eyes: EOM are normal. Pupils are equal, round, and reactive to light. No scleral icterus.  Neck: Neck supple. No JVD present.  Cardiovascular: Normal rate, regular rhythm, normal heart sounds and intact distal pulses.  No murmur heard. Pulmonary/Chest: Effort normal and breath sounds normal. No respiratory distress.  Abdominal: Soft. Bowel sounds are normal. She exhibits no distension. There is no tenderness.  Genitourinary:  Genitourinary Comments: Pelvic: Normal external female genitalia without lesions. Vaginal mucosa and cervix normal without lesions, discharge or bleeding noted on speculum exam. No cervical motion tenderness.   Theresa Mulligan, RN present throughout duration of exam.    Musculoskeletal: Normal range of motion. She exhibits no edema or tenderness.  Lymphadenopathy:    She has no cervical adenopathy.  Neurological: She is alert and oriented to person, place, and time. She exhibits normal muscle tone.  Skin: Skin is warm and dry.  well-defined hyperpigmented linear patches on bilateral neck without scale.   Vitals reviewed.  UC Treatments / Results  Labs (all labs ordered are listed, but only abnormal results  are displayed) Labs Reviewed  HIV ANTIBODY (ROUTINE TESTING)  RPR  CERVICOVAGINAL ANCILLARY ONLY    EKG  EKG Interpretation None       Radiology No results found.  Procedures Procedures (including critical care time)  Medications Ordered in UC Medications - No data to display   Initial Impression / Assessment and Plan / UC Course  I have reviewed the triage vital signs and the nursing notes.  Pertinent labs & imaging results that were available during my care of the patient were reviewed by me and considered in my medical decision making (see chart for details).  Final Clinical Impressions(s) / UC Diagnoses   Final diagnoses:  Contact dermatitis due to jewelry  Routine screening for STI (sexually transmitted infection)   35 y.o. female presenting for routine STI screening and rash around neck consistent with contact dermatitis.  - Checking GC/Chl probe, wet prep, RPR, and HIV. Will call with any positive results.  - Relatively low potency topical steroid for skin rash in addition to avoidance of jewelry. If not improving, will follow up, would consider KOH scraping.   New Prescriptions New Prescriptions   BETAMETHASONE VALERATE LOTION (VALISONE) 0.1 %    Apply 1 application topically 2 (two) times daily.     Tyrone Nine, MD 01/20/16 [phone]

## 2016-01-20 NOTE — ED Triage Notes (Addendum)
Patient reports to small bumps around neck and on arms.  Dry , itchy, splotches on skin.  Issues for 7weeks.    Patient wanting to be checked for std, patient has a new partner.  Reports white discharge and fishy odor. Reports symptoms for a couple of months

## 2016-01-21 LAB — CERVICOVAGINAL ANCILLARY ONLY
Chlamydia: NEGATIVE
Neisseria Gonorrhea: NEGATIVE
WET PREP (BD AFFIRM): NEGATIVE

## 2016-01-21 LAB — RPR: RPR: NONREACTIVE

## 2016-01-21 LAB — HIV ANTIBODY (ROUTINE TESTING W REFLEX): HIV Screen 4th Generation wRfx: NONREACTIVE

## 2016-01-27 ENCOUNTER — Encounter (HOSPITAL_COMMUNITY): Payer: Self-pay | Admitting: Emergency Medicine

## 2016-01-27 ENCOUNTER — Emergency Department (HOSPITAL_COMMUNITY)
Admission: EM | Admit: 2016-01-27 | Discharge: 2016-01-28 | Disposition: A | Discharge status: Home / Self Care | Payer: Medicaid Other | Attending: Emergency Medicine | Admitting: Emergency Medicine

## 2016-01-27 DIAGNOSIS — L0390 Cellulitis, unspecified: Secondary | ICD-10-CM

## 2016-01-27 DIAGNOSIS — L0231 Cutaneous abscess of buttock: Secondary | ICD-10-CM | POA: Insufficient documentation

## 2016-01-27 DIAGNOSIS — F17210 Nicotine dependence, cigarettes, uncomplicated: Secondary | ICD-10-CM | POA: Diagnosis not present

## 2016-01-27 DIAGNOSIS — L0291 Cutaneous abscess, unspecified: Secondary | ICD-10-CM

## 2016-01-27 MED ORDER — LIDOCAINE HCL (PF) 1 % IJ SOLN
30.0000 mL | Freq: Once | INTRAMUSCULAR | Status: AC
  Administered 2016-01-27: 30 mL
  Filled 2016-01-27: qty 30

## 2016-01-27 MED ORDER — CEPHALEXIN 500 MG PO CAPS: 500.0000 mg | ORAL_CAPSULE | Freq: Four times a day (QID) | ORAL | 0 refills | Status: DC

## 2016-01-27 MED ORDER — HYDROCODONE-ACETAMINOPHEN 5-325 MG PO TABS: 1.0000 | ORAL_TABLET | ORAL | 0 refills | Status: DC | PRN

## 2016-01-27 MED ORDER — HYDROCODONE-ACETAMINOPHEN 5-325 MG PO TABS
1.0000 | ORAL_TABLET | Freq: Once | ORAL | Status: AC
  Administered 2016-01-27: 1 via ORAL
  Filled 2016-01-27: qty 1

## 2016-01-27 MED ORDER — NAPROXEN 500 MG PO TABS: 500.0000 mg | ORAL_TABLET | Freq: Two times a day (BID) | ORAL | 0 refills | Status: DC

## 2016-01-27 NOTE — ED Notes (Signed)
Pt found in hallway asking to be taken to a room that her name had been called, pt informed that her name was called multiple times and she was unable to be found, pt stated she was in the bathroom, pt informed she will be placed in a room when available.

## 2016-01-27 NOTE — ED Triage Notes (Signed)
Right buttock boil; she got it to pop three days ago, draining pus and blood. Still sore and knot is getting bigger.

## 2016-01-27 NOTE — ED Provider Notes (Signed)
[facility]-EMERGENCY DEPT Provider Note   CSN: [id number] Arrival date & time: 01/27/16  1806  By signing my name below, I, Christy SartoriusAnastasia Kolousek, attest that this documentation has been prepared under the direction and in the presence of  Cheri FowlerKayla Shourya Macpherson, PA-C. Electronically Signed: Christy SartoriusAnastasia Kolousek, ED Scribe. 01/27/16. 10:35 PM.   History   Chief Complaint Chief Complaint  Patient presents with  . Abscess   The history is provided by the patient. No language interpreter was used.    HPI Comments:  Becky Pham is a 35 y.o. female who presents to the Emergency Department complaining of a painful area of of drainage and swelling on her right buttocks beginning about a week ago.  She states its drainage is a mix of blood and purulence. She has been using a hot compress and "fatback" on the area and states that she saw initial improvement, but that it's now getting worse. She has not been taking anything for the pain. Pt has no known allergies.     Past Medical History:  Diagnosis Date  . Bronchitis     There are no active problems to display for this patient.   Past Surgical History:  Procedure Laterality Date  . TUBAL LIGATION      OB History    No data available       Home Medications    Prior to Admission medications   Medication Sig Start Date End Date Taking? Authorizing Provider  betamethasone valerate lotion (VALISONE) 0.1 % Apply 1 application topically 2 (two) times daily. 01/20/16   Tyrone Nineyan B Grunz, MD  cephALEXin (KEFLEX) 500 MG capsule Take 1 capsule (500 mg total) by mouth 4 (four) times daily. 01/27/16   Cheri FowlerKayla Mackinzee Roszak, PA-C  gabapentin (NEURONTIN) 300 MG capsule Take 1 capsule (300 mg total) by mouth 2 (two) times daily. 10/13/15   Gilda Creasehristopher J Pollina, MD  HYDROcodone-acetaminophen (NORCO/VICODIN) 5-325 MG tablet Take 1 tablet by mouth every 4 (four) hours as needed. 01/27/16   Cheri FowlerKayla Parul Porcelli, PA-C  Multiple Vitamins-Minerals (CENTRUM ADULTS) TABS Take 1 tablet by mouth  daily.    Historical Provider, MD  naproxen (NAPROSYN) 500 MG tablet Take 1 tablet (500 mg total) by mouth 2 (two) times daily. 01/27/16   Cheri FowlerKayla Kasean Denherder, PA-C    Family History Family History  Problem Relation Age of Onset  . Family history unknown: Yes    Social History Social History  Substance Use Topics  . Smoking status: Current Every Day Smoker    Packs/day: 0.25    Types: Cigarettes  . Smokeless tobacco: Never Used  . Alcohol use No     Comment: occ     Allergies   Review of patient's allergies indicates no known allergies.   Review of Systems Review of Systems  Constitutional: Negative for fever.  Skin: Positive for wound (right buttocks).     Physical Exam Updated Vital Signs BP 120/87 (BP Location: Right Arm)   Pulse 81   Temp 98 F (36.7 C) (Oral)   LMP 01/24/2016 (Exact Date)   SpO2 100%   Physical Exam  Constitutional: She is oriented to person, place, and time. She appears well-developed and well-nourished.  HENT:  Head: Normocephalic and atraumatic.  Right Ear: External ear normal.  Left Ear: External ear normal.  Eyes: Conjunctivae are normal. No scleral icterus.  Neck: No tracheal deviation present.  Pulmonary/Chest: Effort normal. No respiratory distress.  Abdominal: She exhibits no distension.  Musculoskeletal: Normal range of motion.  Neurological: She is  alert and oriented to person, place, and time.  Skin: Skin is warm and dry.  2x2 cm area of fluctuance on inferior gluteus with surrounding erythema.  Psychiatric: She has a normal mood and affect. Her behavior is normal.     ED Treatments / Results   DIAGNOSTIC STUDIES:  Oxygen Saturation is 100% on RA, nml by my interpretation.    COORDINATION OF CARE:  10:34 PM Will perform I&D and giver her something for the pain. Discussed treatment plan with pt at bedside and pt agreed to plan.  Labs (all labs ordered are listed, but only abnormal results are displayed) Labs Reviewed - No  data to display  EKG  EKG Interpretation None       Radiology No results found.  Procedures Procedures (including critical care time) INCISION AND DRAINAGE Performed by: Cheri FowlerKayla Raveen Wieseler Consent: Verbal consent obtained. Risks and benefits: risks, benefits and alternatives were discussed Type: abscess  Body area: right buttock  Anesthesia: local infiltration  Incision was made with a scalpel.  Local anesthetic: lidocaine 1% without epinephrine  Anesthetic total: 8 ml  Complexity: complex Blunt dissection to break up loculations  Drainage: purulent  Drainage amount: moderate  Packing material: none  Patient tolerance: Patient tolerated the procedure well with no immediate complications.     Medications Ordered in ED Medications  lidocaine (PF) (XYLOCAINE) 1 % injection 30 mL (30 mLs Infiltration Given 01/27/16 2318)  HYDROcodone-acetaminophen (NORCO/VICODIN) 5-325 MG per tablet 1 tablet (1 tablet Oral Given 01/27/16 2350)     Initial Impression / Assessment and Plan / ED Course  I have reviewed the triage vital signs and the nursing notes.  Pertinent labs & imaging results that were available during my care of the patient were reviewed by me and considered in my medical decision making (see chart for details).  Clinical Course   Patient with skin abscess. Incision and drainage performed in the ED today.  Abscess was not large enough to warrant packing or drain placement. Wound recheck in 2 days. Supportive care and return precautions discussed.  Pt sent home with keflex and norco. The patient appears reasonably screened and/or stabilized for discharge and I doubt any other emergent medical condition requiring further screening, evaluation, or treatment in the ED prior to discharge.    Final Clinical Impressions(s) / ED Diagnoses   Final diagnoses:  Abscess and cellulitis    New Prescriptions New Prescriptions   CEPHALEXIN (KEFLEX) 500 MG CAPSULE    Take 1  capsule (500 mg total) by mouth 4 (four) times daily.   HYDROCODONE-ACETAMINOPHEN (NORCO/VICODIN) 5-325 MG TABLET    Take 1 tablet by mouth every 4 (four) hours as needed.   NAPROXEN (NAPROSYN) 500 MG TABLET    Take 1 tablet (500 mg total) by mouth 2 (two) times daily.   I personally performed the services described in this documentation, which was scribed in my presence. The recorded information has been reviewed and is accurate.     Cheri FowlerKayla Benyamin Jeff, PA-C 01/27/16 2357    Lavera Guiseana Duo Liu, MD 01/28/16 [phone]

## 2016-01-27 NOTE — ED Notes (Signed)
Pt was called to be in roomed with no answer. Bystanders stated pt said she was leaving.

## 2016-01-28 NOTE — ED Notes (Signed)
Abscess to right buttock. Red and swollen. I&D preformed by Cheri FowlerKayla Rose PA

## 2016-01-29 ENCOUNTER — Encounter (HOSPITAL_COMMUNITY): Payer: Self-pay | Admitting: Emergency Medicine

## 2016-01-29 DIAGNOSIS — Z5321 Procedure and treatment not carried out due to patient leaving prior to being seen by health care provider: Secondary | ICD-10-CM | POA: Insufficient documentation

## 2016-01-29 DIAGNOSIS — L0231 Cutaneous abscess of buttock: Secondary | ICD-10-CM | POA: Diagnosis present

## 2016-01-29 LAB — BASIC METABOLIC PANEL
ANION GAP: 3 — AB (ref 5–15)
BUN: 11 mg/dL (ref 6–20)
CHLORIDE: 108 mmol/L (ref 101–111)
CO2: 26 mmol/L (ref 22–32)
Calcium: 8.7 mg/dL — ABNORMAL LOW (ref 8.9–10.3)
Creatinine, Ser: 0.91 mg/dL (ref 0.44–1.00)
GFR calc non Af Amer: >60 mL/min (ref >60)
Glucose, Bld: 97 mg/dL (ref 65–99)
POTASSIUM: 4 mmol/L (ref 3.5–5.1)
SODIUM: 137 mmol/L (ref 135–145)

## 2016-01-29 LAB — CBC WITH DIFFERENTIAL/PLATELET
BASOS ABS: 0.1 10*3/uL (ref 0.0–0.1)
BASOS PCT: 1 %
EOS ABS: 0.1 10*3/uL (ref 0.0–0.7)
Eosinophils Relative: 2 %
HEMATOCRIT: 35.9 % — AB (ref 36.0–46.0)
HEMOGLOBIN: 11.8 g/dL — AB (ref 12.0–15.0)
Lymphocytes Relative: 39 %
Lymphs Abs: 2.3 10*3/uL (ref 0.7–4.0)
MCH: 26.9 pg (ref 26.0–34.0)
MCHC: 32.9 g/dL (ref 30.0–36.0)
MCV: 82 fL (ref 78.0–100.0)
MONOS PCT: 9 %
Monocytes Absolute: 0.5 10*3/uL (ref 0.1–1.0)
NEUTROS ABS: 2.8 10*3/uL (ref 1.7–7.7)
NEUTROS PCT: 49 %
Platelets: 254 10*3/uL (ref 150–400)
RBC: 4.38 MIL/uL (ref 3.87–5.11)
RDW: 14.6 % (ref 11.5–15.5)
WBC: 5.9 10*3/uL (ref 4.0–10.5)

## 2016-01-29 NOTE — ED Triage Notes (Signed)
Pt. reports worsening abscess at right buttocks with purulent drainage onset last week , seen here 2 days ago incised/drained and prescribed with oral antibiotic and pain medication with no relief .Denies fever or chills.

## 2016-01-30 ENCOUNTER — Emergency Department (HOSPITAL_COMMUNITY)
Admission: EM | Admit: 2016-01-30 | Discharge: 2016-01-30 | Disposition: A | Discharge status: Home / Self Care | Payer: Medicaid Other | Attending: Emergency Medicine | Admitting: Emergency Medicine

## 2016-05-17 IMAGING — CR DG CHEST 2V
2 series · 2 of 2 positions shown · non-contrast
Comparison: None.

CLINICAL DATA: Chest pain and shortness of breath.

EXAM:
CHEST  2 VIEW

[chest pa]
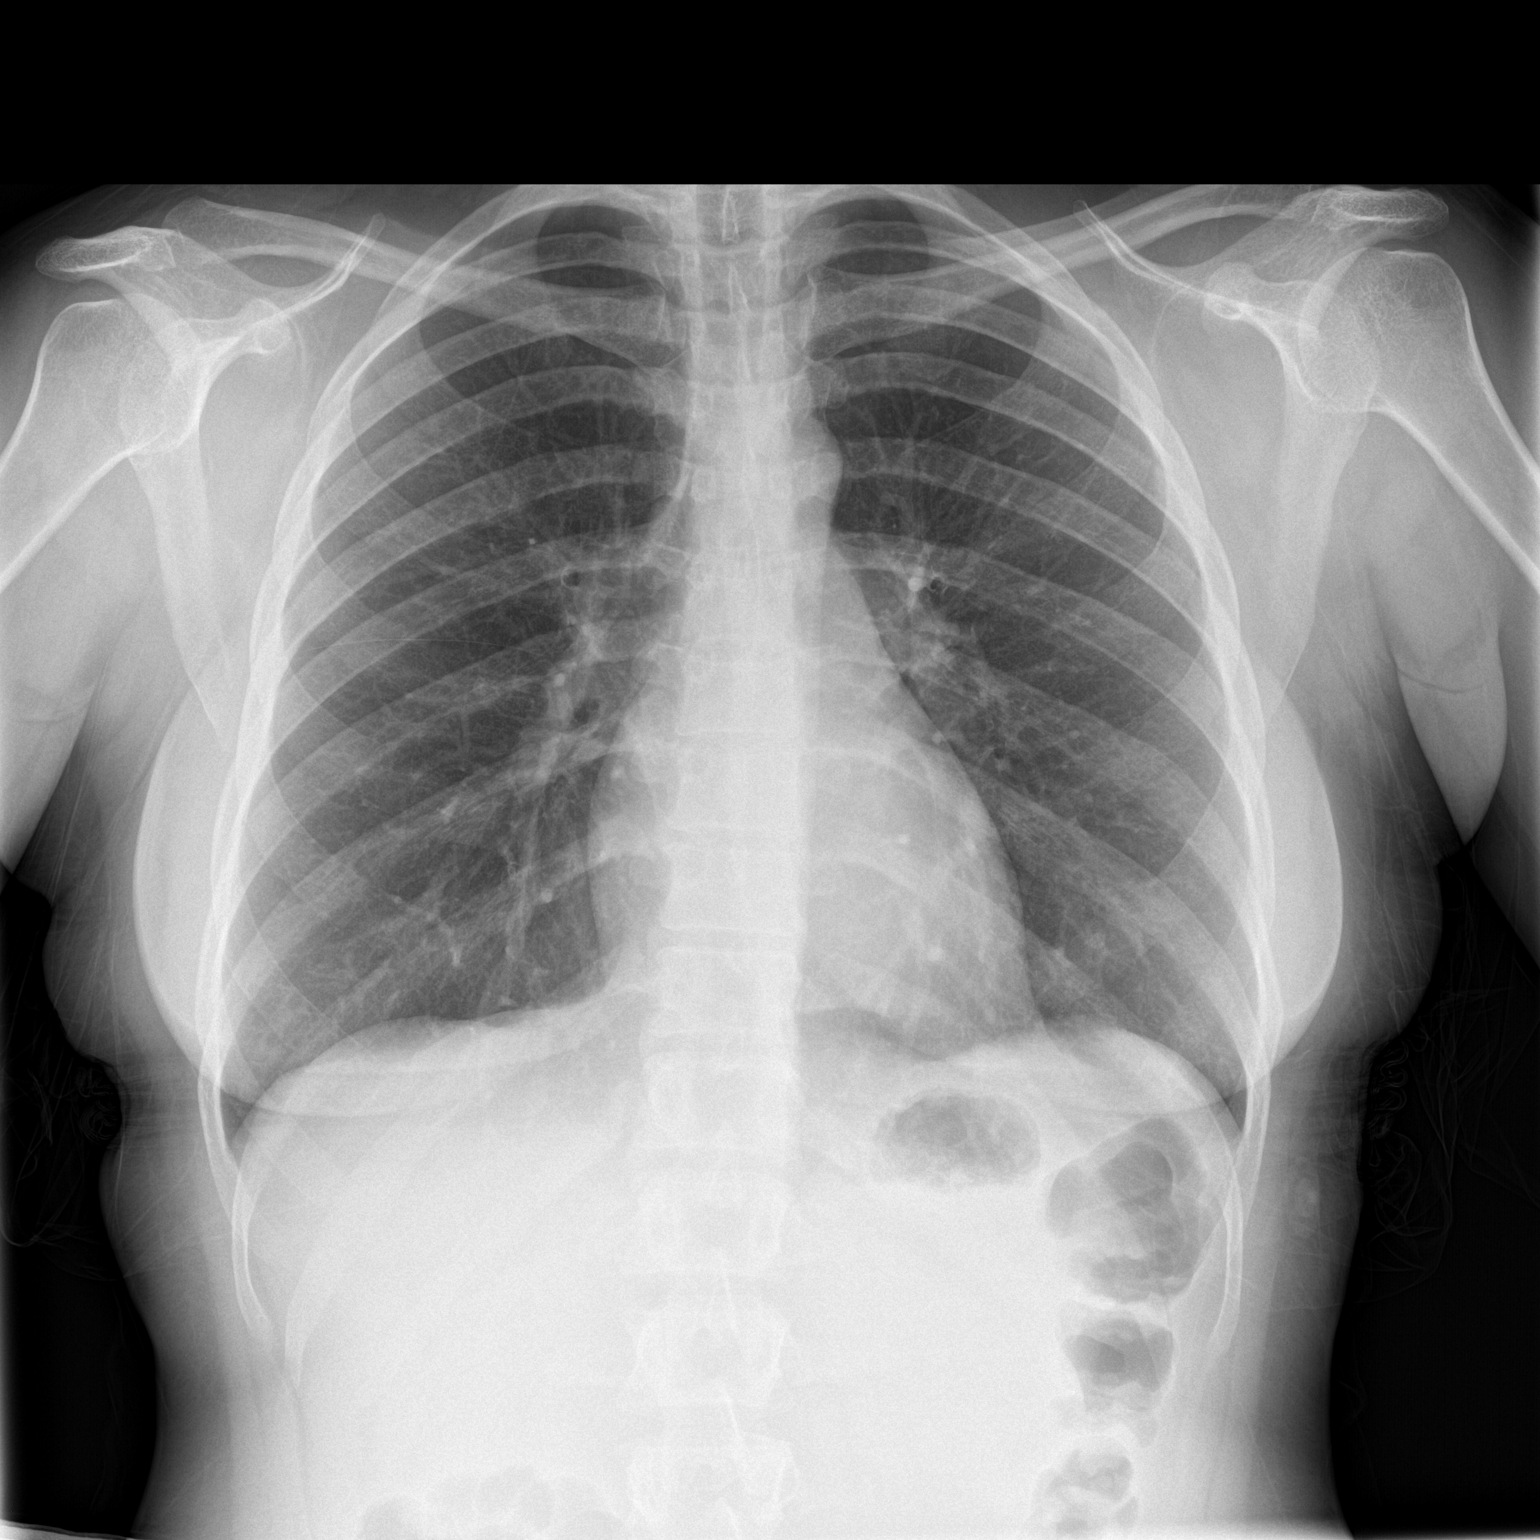

[chest lat]
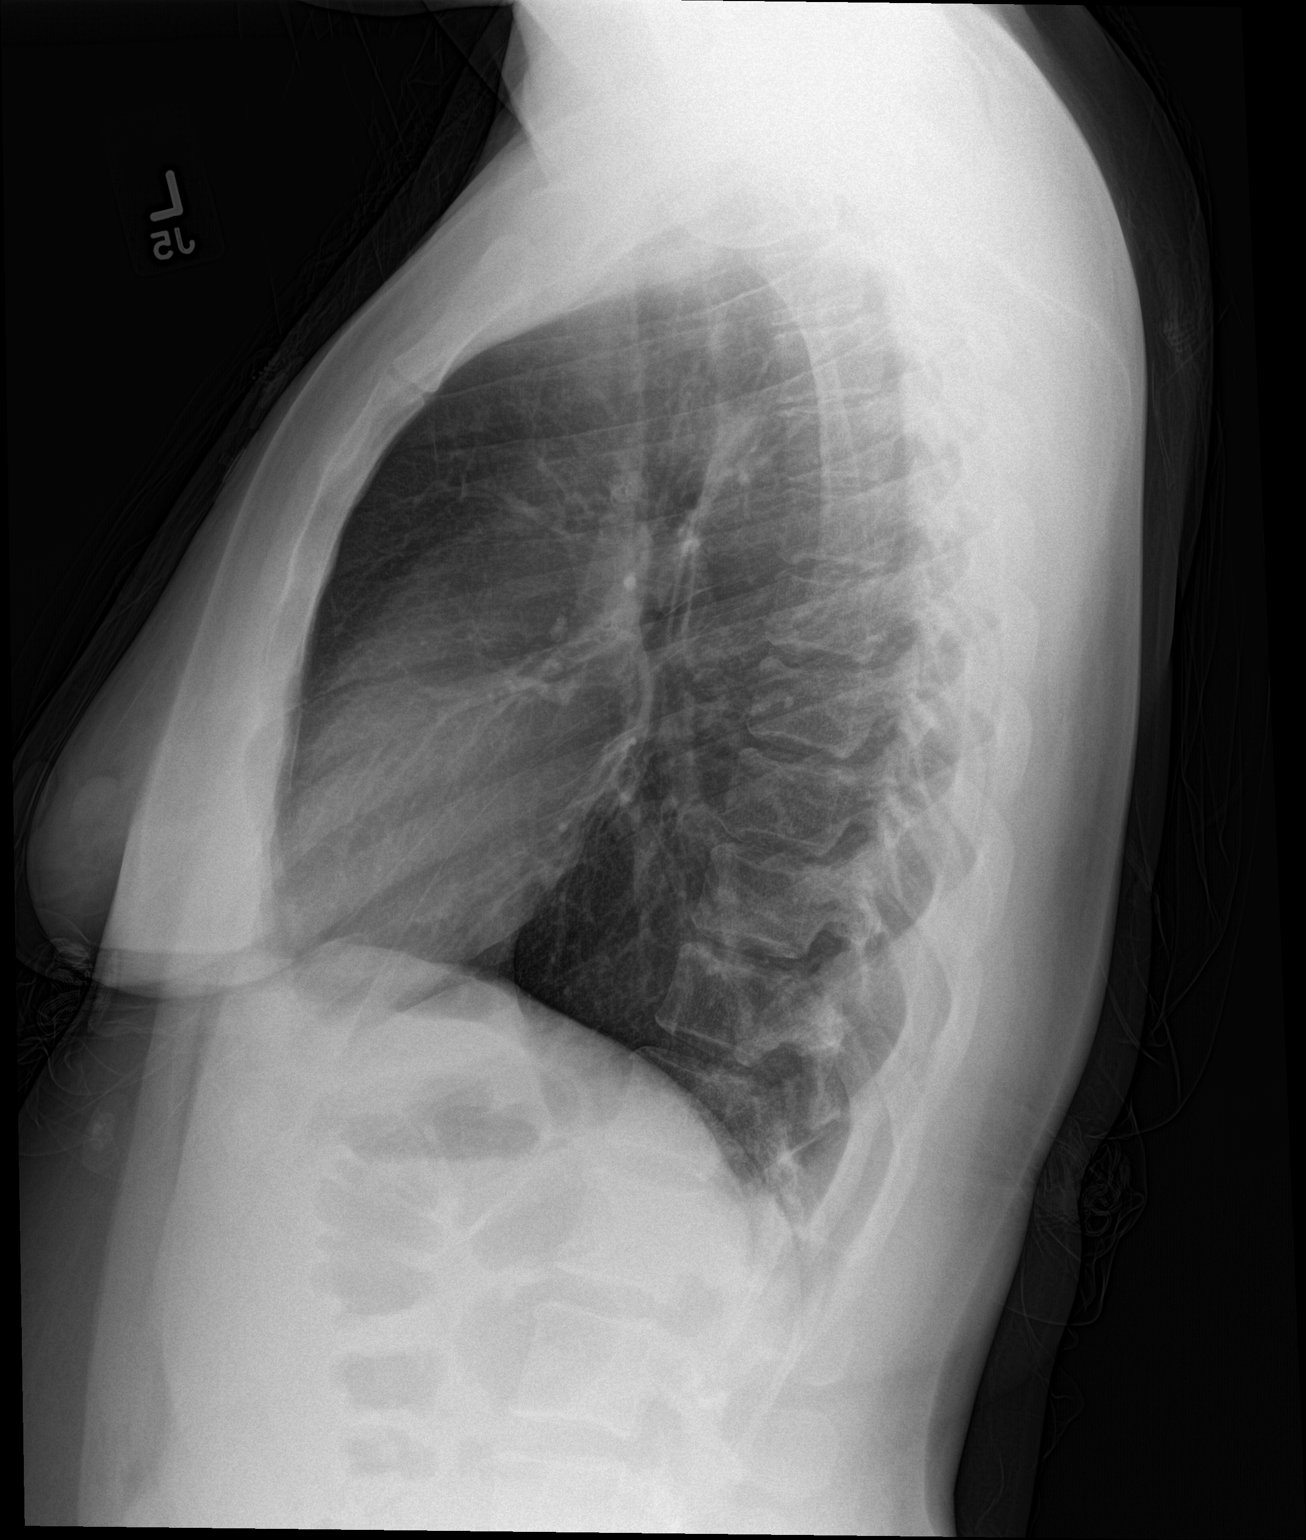

[2 of 2 positions shown; findings below may reference images not displayed]

FINDINGS: The heart size and mediastinal contours are within normal limits.
Both lungs are clear. The visualized skeletal structures are
unremarkable.
IMPRESSION: No active cardiopulmonary disease.

## 2016-08-19 ENCOUNTER — Emergency Department (HOSPITAL_COMMUNITY)
Admission: EM | Admit: 2016-08-19 | Discharge: 2016-08-19 | Disposition: A | Discharge status: Home / Self Care | Payer: Medicaid Other | Attending: Emergency Medicine | Admitting: Emergency Medicine

## 2016-08-19 ENCOUNTER — Encounter (HOSPITAL_COMMUNITY): Payer: Self-pay | Admitting: *Deleted

## 2016-08-19 DIAGNOSIS — H1012 Acute atopic conjunctivitis, left eye: Secondary | ICD-10-CM

## 2016-08-19 DIAGNOSIS — F17210 Nicotine dependence, cigarettes, uncomplicated: Secondary | ICD-10-CM | POA: Insufficient documentation

## 2016-08-19 DIAGNOSIS — H5712 Ocular pain, left eye: Secondary | ICD-10-CM | POA: Diagnosis present

## 2016-08-19 MED ORDER — NAPHAZOLINE-PHENIRAMINE 0.025-0.3 % OP SOLN: 1.0000 [drp] | OPHTHALMIC | 0 refills | Status: AC | PRN

## 2016-08-19 MED ORDER — FLUORESCEIN SODIUM 0.6 MG OP STRP
1.0000 | ORAL_STRIP | Freq: Once | OPHTHALMIC | Status: AC
  Administered 2016-08-19: 1 via OPHTHALMIC
  Filled 2016-08-19: qty 1

## 2016-08-19 MED ORDER — TETRACAINE HCL 0.5 % OP SOLN
1.0000 [drp] | Freq: Once | OPHTHALMIC | Status: AC
  Administered 2016-08-19: 1 [drp] via OPHTHALMIC
  Filled 2016-08-19: qty 2

## 2016-08-19 MED ORDER — POLYMYXIN B-TRIMETHOPRIM 10000-0.1 UNIT/ML-% OP SOLN: 2.0000 [drp] | OPHTHALMIC | 0 refills | Status: AC

## 2016-08-19 NOTE — ED Triage Notes (Signed)
The pt is c/o lt eye pain since yesterday she has irrigated it but it still feels like there is something in it.  This am the lt eye was painful and swollen  lmp last month

## 2016-08-19 NOTE — ED Notes (Signed)
Woods lamp and tonopen at bedside.

## 2016-08-19 NOTE — ED Provider Notes (Signed)
[facility]PT Provider Note   CSN: [id number] Arrival date & time: 08/19/16  1531   By signing my name below, I, Freida Busmaniana Omoyeni, attest that this documentation has been prepared under the direction and in the presence of Lyndal Pulleyaniel Dyana Magner, MD . Electronically Signed: Freida Busmaniana Omoyeni, Scribe. 08/19/2016. 5:05 PM.  History   Chief Complaint Chief Complaint  Patient presents with  . Eye Problem    The history is provided by the patient. No language interpreter was used.   HPI Comments:  Becky Pham is a 36 y.o. female who presents to the Emergency Department complaining of 10/10 left eye pain since yesterday. The pain began after a few hours after cleaning up broken glass. She is unsure if she got a shard of glass in her eye but notes she irrigated the eye yesterday with little relief. She reports associated tearing of the eye. No alleviating factors noted.     Past Medical History:  Diagnosis Date  . Bronchitis     There are no active problems to display for this patient.   Past Surgical History:  Procedure Laterality Date  . TUBAL LIGATION      OB History    No data available       Home Medications    Prior to Admission medications   Medication Sig Start Date End Date Taking? Authorizing Provider  betamethasone valerate lotion (VALISONE) 0.1 % Apply 1 application topically 2 (two) times daily. 01/20/16   Tyrone Nineyan B Grunz, MD  cephALEXin (KEFLEX) 500 MG capsule Take 1 capsule (500 mg total) by mouth 4 (four) times daily. 01/27/16   Cheri FowlerKayla Rose, PA-C  gabapentin (NEURONTIN) 300 MG capsule Take 1 capsule (300 mg total) by mouth 2 (two) times daily. 10/13/15   Gilda Creasehristopher J Pollina, MD  HYDROcodone-acetaminophen (NORCO/VICODIN) 5-325 MG tablet Take 1 tablet by mouth every 4 (four) hours as needed. 01/27/16   Cheri FowlerKayla Rose, PA-C  Multiple Vitamins-Minerals (CENTRUM ADULTS) TABS Take 1 tablet by mouth daily.    Historical Provider, MD  naproxen (NAPROSYN) 500 MG tablet Take 1 tablet  (500 mg total) by mouth 2 (two) times daily. 01/27/16   Cheri FowlerKayla Rose, PA-C    Family History Family History  Problem Relation Age of Onset  . Family history unknown: Yes    Social History Social History  Substance Use Topics  . Smoking status: Current Every Day Smoker    Packs/day: 0.25    Types: Cigarettes  . Smokeless tobacco: Never Used  . Alcohol use Yes     Comment: occ     Allergies   Patient has no known allergies.   Review of Systems Review of Systems  Constitutional: Negative for chills and fever.  Eyes: Positive for pain and discharge.  Respiratory: Negative for shortness of breath.   Cardiovascular: Negative for chest pain.  All other systems reviewed and are negative.    Physical Exam Updated Vital Signs BP 116/80 (BP Location: Left Arm)   Pulse 115   Temp 98.1 F (36.7 C) (Oral)   Resp 22   Ht 5\' 4"  (1.626 m)   Wt 165 lb (74.8 kg)   SpO2 100%   BMI 28.32 kg/m   Physical Exam  Constitutional: She is oriented to person, place, and time. She appears well-developed and well-nourished. No distress.  HENT:  Head: Normocephalic.  Nose: Nose normal.  Eyes: Lids are everted and swept, no foreign bodies found. Left conjunctiva is injected.  Conjunctival injection on the left with mucopurulent drainage  No corneal abrasion. No fluorescein uptake Left IOPs: 14, 14, 18    Neck: Neck supple. No tracheal deviation present.  Cardiovascular: Normal rate and regular rhythm.   Pulmonary/Chest: Effort normal. No respiratory distress.  Abdominal: Soft. She exhibits no distension.  Neurological: She is alert and oriented to person, place, and time.  Skin: Skin is warm and dry.  Psychiatric: She has a normal mood and affect.  Nursing note and vitals reviewed.    ED Treatments / Results  DIAGNOSTIC STUDIES:  Oxygen Saturation is 100% on RA, normal by my interpretation.    COORDINATION OF CARE:  4:59 PM Discussed treatment plan with pt at bedside and pt  agreed to plan.  Labs (all labs ordered are listed, but only abnormal results are displayed) Labs Reviewed - No data to display  EKG  EKG Interpretation None       Radiology No results found.  Procedures Procedures (including critical care time)  Medications Ordered in ED Medications  tetracaine (PONTOCAINE) 0.5 % ophthalmic solution 1 drop (1 drop Left Eye Given by Other 08/19/16 1701)  fluorescein ophthalmic strip 1 strip (1 strip Left Eye Given 08/19/16 1701)     Initial Impression / Assessment and Plan / ED Course  I have reviewed the triage vital signs and the nursing notes.  Pertinent labs & imaging results that were available during my care of the patient were reviewed by me and considered in my medical decision making (see chart for details).     36 year old female presents with left eye pain starting yesterday after cleaning up broken glass off the ground. No evidence of foreign body, appears to have conjunctivitis possibly secondary to prior foreign body or early infection. Will cover with symptomatic eyedrops and Polytrim to prevent bacterial superinfection. No evidence of corneal abrasion, no signs of glaucoma. Otherwise well-appearing and improved following tetracaine administration. Follow-up with primary care physician as needed or return precautions were discussed for worsening or new concerning symptoms.  Final Clinical Impressions(s) / ED Diagnoses   Final diagnoses:  Acute atopic conjunctivitis of left eye    New Prescriptions Discharge Medication List as of 08/19/2016  5:07 PM    START taking these medications   Details  naphazoline-pheniramine (NAPHCON-A) 0.025-0.3 % ophthalmic solution Place 1 drop into the left eye every 4 (four) hours as needed for irritation., Starting Wed 08/19/2016, Until Wed 08/26/2016, Print    trimethoprim-polymyxin b (POLYTRIM) ophthalmic solution Place 2 drops into the left eye every 4 (four) hours., Starting Wed 08/19/2016, Until  Wed 08/26/2016, Print       I personally performed the services described in this documentation, which was scribed in my presence. The recorded information has been reviewed and is accurate.       Lyndal Pulley, MD 08/19/16 [phone]

## 2016-09-09 ENCOUNTER — Emergency Department (HOSPITAL_COMMUNITY)
Admission: EM | Admit: 2016-09-09 | Discharge: 2016-09-09 | Disposition: A | Discharge status: Home / Self Care | Payer: Medicaid Other | Attending: Emergency Medicine | Admitting: Emergency Medicine

## 2016-09-09 ENCOUNTER — Encounter (HOSPITAL_COMMUNITY): Payer: Self-pay

## 2016-09-09 ENCOUNTER — Emergency Department (HOSPITAL_COMMUNITY): Payer: Medicaid Other

## 2016-09-09 DIAGNOSIS — Z79899 Other long term (current) drug therapy: Secondary | ICD-10-CM | POA: Insufficient documentation

## 2016-09-09 DIAGNOSIS — J40 Bronchitis, not specified as acute or chronic: Secondary | ICD-10-CM | POA: Diagnosis not present

## 2016-09-09 DIAGNOSIS — R05 Cough: Secondary | ICD-10-CM | POA: Diagnosis present

## 2016-09-09 DIAGNOSIS — F17210 Nicotine dependence, cigarettes, uncomplicated: Secondary | ICD-10-CM | POA: Insufficient documentation

## 2016-09-09 MED ORDER — PREDNISONE 20 MG PO TABS
60.0000 mg | ORAL_TABLET | Freq: Every day | ORAL | Status: DC
  Administered 2016-09-09: 60 mg via ORAL
  Filled 2016-09-09: qty 3

## 2016-09-09 MED ORDER — IPRATROPIUM-ALBUTEROL 0.5-2.5 (3) MG/3ML IN SOLN
3.0000 mL | Freq: Once | RESPIRATORY_TRACT | Status: AC
  Administered 2016-09-09: 3 mL via RESPIRATORY_TRACT
  Filled 2016-09-09: qty 3

## 2016-09-09 MED ORDER — AZITHROMYCIN 250 MG PO TABS
500.0000 mg | ORAL_TABLET | Freq: Once | ORAL | Status: AC
  Administered 2016-09-09: 500 mg via ORAL
  Filled 2016-09-09: qty 2

## 2016-09-09 MED ORDER — PREDNISONE 10 MG PO TABS: ORAL_TABLET | ORAL | 0 refills | Status: DC

## 2016-09-09 MED ORDER — ALBUTEROL SULFATE HFA 108 (90 BASE) MCG/ACT IN AERS
2.0000 | INHALATION_SPRAY | RESPIRATORY_TRACT | Status: DC
  Administered 2016-09-09: 2 via RESPIRATORY_TRACT
  Filled 2016-09-09: qty 6.7

## 2016-09-09 MED ORDER — AZITHROMYCIN 250 MG PO TABS: 250.0000 mg | ORAL_TABLET | Freq: Every day | ORAL | 0 refills | Status: DC

## 2016-09-09 NOTE — ED Notes (Signed)
Pt also states that she is out of her inhaler

## 2016-09-09 NOTE — ED Triage Notes (Signed)
Pt called, no answer. Attempt 1 

## 2016-09-09 NOTE — ED Provider Notes (Signed)
[facility]-EMERGENCY DEPT Provider Note   CSN: [id number] Arrival date & time: 09/09/16  1933 By signing my name below, I, Becky Pham, attest that this documentation has been prepared under the direction and in the presence of Langston MaskerKaren Dura Mccormack, [place]PA-C. Electronically Signed: Bridgette HabermannMaria Pham, ED Scribe. 09/09/16. 9:36 PM.  History   Chief Complaint Chief Complaint  Patient presents with  . URI    HPI The history is provided by the patient. No language interpreter was used.   HPI Comments: Becky Pham is a 36 y.o. female with h/o bronchitis, who presents to the Emergency Department complaining of productive cough onset one week ago with associated congestion. Pt states she has chest pain secondary to her cough. She has tried many OTC medications PTA with no relief to her symptoms. No h/o asthma, COPD. Pt is a smoker. Denies fever, chills, or any other associated symptoms.  Past Medical History:  Diagnosis Date  . Bronchitis     There are no active problems to display for this patient.   Past Surgical History:  Procedure Laterality Date  . TUBAL LIGATION      OB History    No data available       Home Medications    Prior to Admission medications   Medication Sig Start Date End Date Taking? Authorizing Provider  betamethasone valerate lotion (VALISONE) 0.1 % Apply 1 application topically 2 (two) times daily. 01/20/16   Tyrone Nineyan B Grunz, MD  cephALEXin (KEFLEX) 500 MG capsule Take 1 capsule (500 mg total) by mouth 4 (four) times daily. 01/27/16   Cheri FowlerKayla Rose, PA-C  gabapentin (NEURONTIN) 300 MG capsule Take 1 capsule (300 mg total) by mouth 2 (two) times daily. 10/13/15   Gilda Creasehristopher J Pollina, MD  HYDROcodone-acetaminophen (NORCO/VICODIN) 5-325 MG tablet Take 1 tablet by mouth every 4 (four) hours as needed. 01/27/16   Cheri FowlerKayla Rose, PA-C  Multiple Vitamins-Minerals (CENTRUM ADULTS) TABS Take 1 tablet by mouth daily.    Historical Provider, MD  naproxen (NAPROSYN) 500 MG tablet Take 1 tablet (500 mg  total) by mouth 2 (two) times daily. 01/27/16   Cheri FowlerKayla Rose, PA-C    Family History Family History  Problem Relation Age of Onset  . Family history unknown: Yes    Social History Social History  Substance Use Topics  . Smoking status: Current Every Day Smoker    Packs/day: 0.25    Types: Cigarettes  . Smokeless tobacco: Never Used  . Alcohol use Yes     Comment: occ     Allergies   Patient has no known allergies.   Review of Systems Review of Systems  Constitutional: Negative for chills and fever.  Respiratory: Positive for cough.   Cardiovascular: Positive for chest pain.  All other systems reviewed and are negative.    Physical Exam Updated Vital Signs BP 119/74 (BP Location: Left Arm)   Pulse 93   Temp 98.9 F (37.2 C) (Oral)   Resp 18   LMP 08/26/2016   SpO2 96%   Physical Exam  Constitutional: She appears well-developed and well-nourished.  HENT:  Head: Normocephalic.  Eyes: Conjunctivae are normal.  Cardiovascular: Normal rate.   Pulmonary/Chest: Effort normal. No respiratory distress. She has wheezes.  Wheezing in all lung fields.  Abdominal: She exhibits no distension.  Musculoskeletal: Normal range of motion.  Neurological: She is alert.  Skin: Skin is warm and dry.  Psychiatric: She has a normal mood and affect. Her behavior is normal.  Nursing note and vitals reviewed.  ED Treatments / Results  DIAGNOSTIC STUDIES: Oxygen Saturation is 96% on RA, adequate by my interpretation.   COORDINATION OF CARE: 9:36 PM-Discussed next steps with pt. Pt verbalized understanding and is agreeable with the plan.   Labs (all labs ordered are listed, but only abnormal results are displayed) Labs Reviewed - No data to display  EKG  EKG Interpretation None       Radiology Dg Chest 2 View  Result Date: 09/09/2016 CLINICAL DATA:  Initial evaluation for acute cough. EXAM: CHEST  2 VIEW COMPARISON:  Prior radiograph 12/13/2014. FINDINGS: The  cardiac and mediastinal silhouettes are stable in size and contour, and remain within normal limits. The lungs are mildly hypoinflated with elevation left hemidiaphragm. Mild central peribronchial thickening No airspace consolidation, pleural effusion, or pulmonary edema is identified. There is no pneumothorax. No acute osseous abnormality identified. IMPRESSION: Subtle mild central peribronchial thickening, suggestive of possible acute bronchiolitis given the history of cough. No consolidative opacity to suggest pneumonia. Electronically Signed   By: Becky Pham M.D.   On: 09/09/2016 20:17    Procedures Procedures (including critical care time)  Medications Ordered in ED Medications - No data to display   Initial Impression / Assessment and Plan / ED Course  I have reviewed the triage vital signs and the nursing notes.  Pertinent labs & imaging results that were available during my care of the patient were reviewed by me and considered in my medical decision making (see chart for details).       Final Clinical Impressions(s) / ED Diagnoses   Final diagnoses:  Bronchitis   Meds ordered this encounter  Medications  . ipratropium-albuterol (DUONEB) 0.5-2.5 (3) MG/3ML nebulizer solution 3 mL  . albuterol (PROVENTIL HFA;VENTOLIN HFA) 108 (90 Base) MCG/ACT inhaler 2 puff  . predniSONE (DELTASONE) tablet 60 mg  . azithromycin (ZITHROMAX) tablet 500 mg  . azithromycin (ZITHROMAX) 250 MG tablet    Sig: Take 1 tablet (250 mg total) by mouth daily. Take first 2 tablets together, then 1 every day until finished.    Dispense:  6 tablet    Refill:  0    Order Specific Question:   Supervising Provider    Answer:   MILLER, BRIAN [[id number]]  . predniSONE (DELTASONE) 10 MG tablet    Sig: 5,4,3,2,1 taper    Dispense:  15 tablet    Refill:  0    Order Specific Question:   Supervising Provider    Answer:   Eber Hong [[id number]]   New Prescriptions Discharge Medication List as of 09/09/2016  10:55 PM     No outpatient prescriptions have been marked as taking for the 09/09/16 encounter [facility] Encounter).   An After Visit Summary was printed and given to the patient.  I personally performed the services in this documentation, which was scribed in my presence.  The recorded information has been reviewed and considered.   Barnet Pall.     Lonia Skinner [place], PA-C 09/10/16 0131    Lavera Guise, MD 09/10/16 [phone]

## 2016-09-09 NOTE — Discharge Instructions (Signed)
Return if any problems.

## 2016-09-09 NOTE — ED Triage Notes (Signed)
Pt complains of a chest cold for about one week, OTC meds aren't working and now her chest hurts from coughing, pt states she has had a productive cough this week

## 2017-02-20 ENCOUNTER — Emergency Department (HOSPITAL_COMMUNITY)
Admission: EM | Admit: 2017-02-20 | Discharge: 2017-02-20 | Disposition: A | Discharge status: Home / Self Care | Payer: Medicaid Other | Attending: Emergency Medicine | Admitting: Emergency Medicine

## 2017-02-20 DIAGNOSIS — Z79899 Other long term (current) drug therapy: Secondary | ICD-10-CM | POA: Insufficient documentation

## 2017-02-20 DIAGNOSIS — R21 Rash and other nonspecific skin eruption: Secondary | ICD-10-CM | POA: Diagnosis present

## 2017-02-20 DIAGNOSIS — F17210 Nicotine dependence, cigarettes, uncomplicated: Secondary | ICD-10-CM | POA: Diagnosis not present

## 2017-02-20 DIAGNOSIS — B354 Tinea corporis: Secondary | ICD-10-CM | POA: Diagnosis not present

## 2017-02-20 MED ORDER — CLOTRIMAZOLE 1 % EX CREA
TOPICAL_CREAM | Freq: Two times a day (BID) | CUTANEOUS | Status: DC
  Administered 2017-02-20: 13:00:00 via TOPICAL
  Filled 2017-02-20: qty 15

## 2017-02-20 MED ORDER — PREDNISONE 20 MG PO TABS
60.0000 mg | ORAL_TABLET | Freq: Once | ORAL | Status: AC
  Administered 2017-02-20: 60 mg via ORAL
  Filled 2017-02-20: qty 3

## 2017-02-20 MED ORDER — FAMOTIDINE 20 MG PO TABS: 20.0000 mg | ORAL_TABLET | Freq: Two times a day (BID) | ORAL | 0 refills | Status: DC

## 2017-02-20 MED ORDER — TRIAMCINOLONE ACETONIDE 0.1 % EX CREA
TOPICAL_CREAM | Freq: Two times a day (BID) | CUTANEOUS | Status: DC
  Administered 2017-02-20: 13:00:00 via TOPICAL
  Filled 2017-02-20: qty 15

## 2017-02-20 NOTE — ED Notes (Signed)
Declined W/C at D/C and was escorted to lobby by RN. 

## 2017-02-20 NOTE — Discharge Instructions (Signed)
Apply the antifungal cream twice daily for treatment of the fungal infection. Also apply corticosteroid cream twice daily for treatment of itching. You may take Benadryl every 6 hours as needed for itching, as well as Pepcid twice daily in between. Follow up with a primary care physician for reevaluation of your symptoms if they persist. Return to the ED immediately if any concerning signs or symptoms develop such as fevers, chills, blistering of the skin, or swelling of the face, lips, or tongue or difficulty breathing.

## 2017-02-20 NOTE — ED Triage Notes (Signed)
Pt presents to the ed with complaints of a rash x 1 week. Pt states she had fungal infection on her neck three weeks ago that she got treated and went away. 1 week ago she noticed a rash pop up on her arm and is now on both arms her neck back and face.

## 2017-02-20 NOTE — ED Provider Notes (Signed)
[facility]EM[facility]ENCY DEPT Provider Note   CSN: [id number] Arrival date & time: 02/20/17  1057     History   Chief Complaint Chief Complaint  Patient presents with  . Rash    HPI Becky Pham is a 36 y.o. female who presents today with chief complaint acute onset, progressively worsening rash for 1 week. She states that the rash initially developed on her neck while she was in a detox program and she was told that it was a fungus. She states that she was not given anything for this rash. She states the rash has begun to spread to her bilateral arms and down her back. Rash is pruritic. She has tried Benadryl without relief of her symptoms. She denies fevers, chills, nausea, vomiting, chest pain, shortness of breath, or any other symptoms. She denies being in a heavily wooded area or obtaining any insect bites. She denies new soaps, shampoos, lotions, or detergents. No one at home has a similar rash.  The history is provided by the patient.    Past Medical History:  Diagnosis Date  . Bronchitis     There are no active problems to display for this patient.   Past Surgical History:  Procedure Laterality Date  . TUBAL LIGATION      OB History    No data available       Home Medications    Prior to Admission medications   Medication Sig Start Date End Date Taking? Authorizing Provider  azithromycin (ZITHROMAX) 250 MG tablet Take 1 tablet (250 mg total) by mouth daily. Take first 2 tablets together, then 1 every day until finished. 09/09/16   Elson AreasSofia, Leslie K, PA-C  betamethasone valerate lotion (VALISONE) 0.1 % Apply 1 application topically 2 (two) times daily. 01/20/16   Tyrone NineGrunz, Ryan B, MD  cephALEXin (KEFLEX) 500 MG capsule Take 1 capsule (500 mg total) by mouth 4 (four) times daily. 01/27/16   Cheri Fowlerose, Kayla, PA-C  famotidine (PEPCID) 20 MG tablet Take 1 tablet (20 mg total) by mouth 2 (two) times daily. 02/20/17   Rafeef Lau A, PA-C  gabapentin (NEURONTIN) 300 MG capsule Take 1 capsule  (300 mg total) by mouth 2 (two) times daily. 10/13/15   Gilda CreasePollina, Christopher J, MD  HYDROcodone-acetaminophen (NORCO/VICODIN) 5-325 MG tablet Take 1 tablet by mouth every 4 (four) hours as needed. 01/27/16   Cheri Fowlerose, Kayla, PA-C  Multiple Vitamins-Minerals (CENTRUM ADULTS) TABS Take 1 tablet by mouth daily.    [provider]  naproxen (NAPROSYN) 500 MG tablet Take 1 tablet (500 mg total) by mouth 2 (two) times daily. 01/27/16   Cheri Fowlerose, Kayla, PA-C  predniSONE (DELTASONE) 10 MG tablet 5,4,3,2,1 taper 09/09/16   Elson AreasSofia, Leslie K, PA-C    Family History Family History  Problem Relation Age of Onset  . Family history unknown: Yes    Social History Social History  Substance Use Topics  . Smoking status: Current Every Day Smoker    Packs/day: 0.25    Types: Cigarettes  . Smokeless tobacco: Never Used  . Alcohol use Yes     Comment: occ     Allergies   Patient has no known allergies.   Review of Systems Review of Systems  Constitutional: Negative for chills and fever.  Respiratory: Negative for shortness of breath.   Cardiovascular: Negative for chest pain.  Gastrointestinal: Negative for abdominal pain, nausea and vomiting.  Skin: Positive for rash.     Physical Exam Updated Vital Signs BP (!) 123/95   Pulse 88  Temp 98.4 F (36.9 C) (Oral)   Resp 18   Wt 74.8 kg (165 lb)   SpO2 100%   BMI 28.32 kg/m   Physical Exam  Constitutional: She appears well-developed and well-nourished. No distress.  HENT:  Head: Normocephalic and atraumatic.  Mouth/Throat: Oropharynx is clear and moist.  No swelling of the lips or tongue, airway is patent  Eyes: Conjunctivae are normal. Right eye exhibits no discharge. Left eye exhibits no discharge.  Neck: Normal range of motion. Neck supple. No JVD present. No tracheal deviation present.  Cardiovascular: Normal rate and intact distal pulses.   2+ radial pulses bilaterally  Pulmonary/Chest: Effort normal.  Abdominal: She exhibits no  distension.  Musculoskeletal: She exhibits no edema.  Neurological: She is alert.  Skin: Skin is warm and dry. Rash noted. No erythema.  Raised annular rash with central clearing noted to the right side of the neck. There are satellite lesions as well as raised erythematous bumps <19mm. this rash extends down the upper portion of the back and is also present to the volar aspect of the bilateral forearms. Excoriations are noted. No pustules, vesicles, desquamation. The rash spares the palms and soles  Psychiatric: She has a normal mood and affect. Her behavior is normal.  Nursing note and vitals reviewed.    ED Treatments / Results  Labs (all labs ordered are listed, but only abnormal results are displayed) Labs Reviewed - No data to display  EKG  EKG Interpretation None       Radiology No results found.  Procedures Procedures (including critical care time)  Medications Ordered in ED Medications  clotrimazole (LOTRIMIN) 1 % cream (not administered)  predniSONE (DELTASONE) tablet 60 mg (not administered)  triamcinolone cream (KENALOG) 0.1 % (not administered)     Initial Impression / Assessment and Plan / ED Course  I have reviewed the triage vital signs and the nursing notes.  Pertinent labs & imaging results that were available during my care of the patient were reviewed by me and considered in my medical decision making (see chart for details).     Rash consistent with ringworm. Afebrile, vital signs are stable Patient denies any difficulty breathing or swallowing.  Pt has a patent airway without stridor and is handling secretions without difficulty; no angioedema. No blisters, no pustules, no warmth, no draining sinus tracts, no superficial abscesses, no bullous impetigo, no vesicles, no desquamation, no target lesions with dusky purpura or a central bulla. Not tender to touch. No concern for superimposed infection. No concern for SJS, TEN, TSS, tick borne illness,  syphilis or other life-threatening condition. Will discharge home with short course of Lotrimin cream, topical corticosteroid, pepcid and recommend Benadryl as needed for pruritis. Discussed indications for return to the ED. Reglan follow-up with primary care physician for reevaluation of rash if it persists. Pt verbalized understanding of and agreement with plan and is safe for discharge home at this time.  Final Clinical Impressions(s) / ED Diagnoses   Final diagnoses:  Ringworm of body    New Prescriptions New Prescriptions   FAMOTIDINE (PEPCID) 20 MG TABLET    Take 1 tablet (20 mg total) by mouth 2 (two) times daily.     Jeanie Sewer, PA-C 02/20/17 1307    Tilden Fossa, MD 02/23/17 1325

## 2017-02-20 NOTE — ED Triage Notes (Signed)
PT called x2 in front lobby

## 2017-03-25 ENCOUNTER — Encounter (HOSPITAL_COMMUNITY): Payer: Self-pay | Admitting: Family Medicine

## 2017-03-25 ENCOUNTER — Ambulatory Visit (HOSPITAL_COMMUNITY)
Admission: EM | Admit: 2017-03-25 | Discharge: 2017-03-25 | Disposition: A | Discharge status: Home / Self Care | Payer: Medicaid Other | Attending: Family Medicine | Admitting: Family Medicine

## 2017-03-25 DIAGNOSIS — R102 Pelvic and perineal pain: Secondary | ICD-10-CM

## 2017-03-25 DIAGNOSIS — F17210 Nicotine dependence, cigarettes, uncomplicated: Secondary | ICD-10-CM | POA: Insufficient documentation

## 2017-03-25 DIAGNOSIS — Z202 Contact with and (suspected) exposure to infections with a predominantly sexual mode of transmission: Secondary | ICD-10-CM

## 2017-03-25 DIAGNOSIS — N3000 Acute cystitis without hematuria: Secondary | ICD-10-CM | POA: Insufficient documentation

## 2017-03-25 DIAGNOSIS — Z3202 Encounter for pregnancy test, result negative: Secondary | ICD-10-CM

## 2017-03-25 LAB — POCT URINALYSIS DIP (DEVICE)
Bilirubin Urine: NEGATIVE
GLUCOSE, UA: NEGATIVE mg/dL
Ketones, ur: NEGATIVE mg/dL
NITRITE: NEGATIVE
PROTEIN: NEGATIVE mg/dL
Specific Gravity, Urine: 1.015 (ref 1.005–1.030)
UROBILINOGEN UA: 0.2 mg/dL (ref 0.0–1.0)
pH: 6 (ref 5.0–8.0)

## 2017-03-25 LAB — POCT PREGNANCY, URINE: PREG TEST UR: NEGATIVE

## 2017-03-25 MED ORDER — CEFTRIAXONE SODIUM 250 MG IJ SOLR
INTRAMUSCULAR | Status: AC
  Filled 2017-03-25: qty 250

## 2017-03-25 MED ORDER — CEFTRIAXONE SODIUM 250 MG IJ SOLR
250.0000 mg | Freq: Once | INTRAMUSCULAR | Status: AC
  Administered 2017-03-25: 250 mg via INTRAMUSCULAR

## 2017-03-25 MED ORDER — STERILE WATER FOR INJECTION IJ SOLN
INTRAMUSCULAR | Status: AC
  Filled 2017-03-25: qty 10

## 2017-03-25 MED ORDER — AZITHROMYCIN 250 MG PO TABS
1000.0000 mg | ORAL_TABLET | Freq: Once | ORAL | Status: AC
  Administered 2017-03-25: 1000 mg via ORAL

## 2017-03-25 MED ORDER — AZITHROMYCIN 250 MG PO TABS
ORAL_TABLET | ORAL | Status: AC
  Filled 2017-03-25: qty 4

## 2017-03-25 MED ORDER — SULFAMETHOXAZOLE-TRIMETHOPRIM 800-160 MG PO TABS: 1.0000 | ORAL_TABLET | Freq: Two times a day (BID) | ORAL | 0 refills | Status: AC

## 2017-03-25 NOTE — ED Triage Notes (Signed)
A previous partner told PT to be checked for STDS. PT has no symptoms.

## 2017-03-25 NOTE — ED Notes (Signed)
UA sample collected  

## 2017-03-25 NOTE — ED Provider Notes (Signed)
[facility] CARE CENTER   [id number] 03/25/17 Arrival Time: 1222   SUBJECTIVE:  Becky Pham is a 36 y.o. female who presents to the urgent care with complaint of STD exposure.   Her "ex" was treated last week and for the last couple days patient has been having a light discharge and slight amount of intermittent pelvic pain    Past Medical History:  Diagnosis Date  . Bronchitis    Family History  Problem Relation Age of Onset  . Family history unknown: Yes   Social History   Social History  . Marital status: Single    Spouse name: N/A  . Number of children: N/A  . Years of education: N/A   Occupational History  . Not on file.   Social History Main Topics  . Smoking status: Current Every Day Smoker    Packs/day: 0.25    Types: Cigarettes  . Smokeless tobacco: Never Used  . Alcohol use Yes     Comment: occ  . Drug use: No  . Sexual activity: Not on file   Other Topics Concern  . Not on file   Social History Narrative  . No narrative on file   No outpatient prescriptions have been marked as taking for the 03/25/17 encounter [facility] Encounter).   No Known Allergies    ROS: As per HPI, remainder of ROS negative.   OBJECTIVE:   Vitals:   03/25/17 1238 03/25/17 1239  BP: 122/90   Pulse: 78   Resp: 16   Temp: 98.1 F (36.7 C)   TempSrc: Oral   SpO2: 100%   Weight:  160 lb (72.6 kg)  Height:   (1.626 m)     General appearance: alert; no distress Eyes: PERRL; EOMI; conjunctiva normal HENT: normocephalic; atraumatic;  external ears normal without trauma; nasal mucosa normal; oral mucosa normal Neck: supple Abdomen: soft, non-tender; bowel sounds normal; no masses or organomegaly; no guarding or rebound tenderness Back: no CVA tenderness Extremities: no cyanosis or edema; symmetrical with no gross deformities Skin: warm and dry Neurologic: normal gait; grossly normal Psychological: alert and cooperative; normal mood and  affect      Labs:  Results for orders placed or performed during the hospital encounter of 03/25/17  POCT urinalysis dip (device)  Result Value Ref Range   Glucose, UA NEGATIVE NEGATIVE mg/dL   Bilirubin Urine NEGATIVE NEGATIVE   Ketones, ur NEGATIVE NEGATIVE mg/dL   Specific Gravity, Urine 1.015 1.005 - 1.030   Hgb urine dipstick TRACE (A) NEGATIVE   pH 6.0 5.0 - 8.0   Protein, ur NEGATIVE NEGATIVE mg/dL   Urobilinogen, UA 0.2 0.0 - 1.0 mg/dL   Nitrite NEGATIVE NEGATIVE   Leukocytes, UA LARGE (A) NEGATIVE  Pregnancy, urine POC  Result Value Ref Range   Preg Test, Ur NEGATIVE NEGATIVE    Labs Reviewed  POCT URINALYSIS DIP (DEVICE) - Abnormal; Notable for the following:       Result Value   Hgb urine dipstick TRACE (*)    Leukocytes, UA LARGE (*)    All other components within normal limits  URINE CULTURE  POCT PREGNANCY, URINE  URINE CYTOLOGY ANCILLARY ONLY    No results found.     ASSESSMENT & PLAN:  1. STD exposure   2. Acute cystitis without hematuria     Meds ordered this encounter  Medications  . azithromycin (ZITHROMAX) tablet 1,000 mg  . cefTRIAXone (ROCEPHIN) injection 250 mg  . sulfamethoxazole-trimethoprim (BACTRIM DS,SEPTRA DS) 800-160 MG tablet  Sig: Take 1 tablet by mouth 2 (two) times daily.    Dispense:  14 tablet    Refill:  0    Reviewed expectations re: course of current medical issues. Questions answered. Outlined signs and symptoms indicating need for more acute intervention. Patient verbalized understanding. After Visit Summary given.    Procedures:      Elvina Sidle, MD 03/25/17 1324

## 2017-03-26 LAB — URINE CYTOLOGY ANCILLARY ONLY
Chlamydia: NEGATIVE
Neisseria Gonorrhea: NEGATIVE
Trichomonas: POSITIVE — AB

## 2017-03-27 LAB — URINE CULTURE

## 2017-03-29 ENCOUNTER — Telehealth (HOSPITAL_COMMUNITY): Payer: Self-pay | Admitting: Emergency Medicine

## 2017-03-29 LAB — URINE CYTOLOGY ANCILLARY ONLY
Bacterial vaginitis: NEGATIVE
Candida vaginitis: NEGATIVE

## 2017-03-29 MED ORDER — METRONIDAZOLE 500 MG PO TABS: 500.0000 mg | ORAL_TABLET | Freq: Two times a day (BID) | ORAL | 0 refills | Status: DC

## 2017-03-29 NOTE — Telephone Encounter (Signed)
Called pt to notify of recent lab results.... Pt ID'd properly Reports feeling better and sx are subsiding Pt requested for Flagyl to be called into [facility] ([address]).  Adv pt if sx are not getting better to return or to f/u w/PCP Education on safe sex given Notified pt that lab results can be obtained through MyChart Pt verb understanding.

## 2017-03-29 NOTE — Telephone Encounter (Signed)
-----   Message from Eustace Moore, MD sent at 03/27/2017  9:51 AM EDT ----- Additional lab result:  clinical staff, please let patient know that urine culture does not clearly demonstrate a UTI.   Other possible causes of urinary discomfort include chafing; irritation from hygiene product; other pelvic infection (yeast, BV) or STD; occasional dietary cause (caffeine); bowel issue; low estrogen effect; kidney stone passage; or interstitial cystitis.   See also result note of 10/12. Recheck or followup with PCP for further evaluation if symptoms are not improving.  LM

## 2017-07-15 ENCOUNTER — Ambulatory Visit (HOSPITAL_COMMUNITY)
Admission: EM | Admit: 2017-07-15 | Discharge: 2017-07-15 | Disposition: A | Discharge status: Home / Self Care | Payer: Medicaid Other | Attending: Family Medicine | Admitting: Family Medicine

## 2017-07-15 ENCOUNTER — Encounter (HOSPITAL_COMMUNITY): Payer: Self-pay | Admitting: Emergency Medicine

## 2017-07-15 ENCOUNTER — Other Ambulatory Visit: Payer: Self-pay

## 2017-07-15 DIAGNOSIS — R350 Frequency of micturition: Secondary | ICD-10-CM | POA: Insufficient documentation

## 2017-07-15 DIAGNOSIS — N390 Urinary tract infection, site not specified: Secondary | ICD-10-CM

## 2017-07-15 DIAGNOSIS — J40 Bronchitis, not specified as acute or chronic: Secondary | ICD-10-CM | POA: Diagnosis not present

## 2017-07-15 DIAGNOSIS — N898 Other specified noninflammatory disorders of vagina: Secondary | ICD-10-CM | POA: Insufficient documentation

## 2017-07-15 DIAGNOSIS — Z9851 Tubal ligation status: Secondary | ICD-10-CM | POA: Diagnosis not present

## 2017-07-15 DIAGNOSIS — F17210 Nicotine dependence, cigarettes, uncomplicated: Secondary | ICD-10-CM | POA: Insufficient documentation

## 2017-07-15 LAB — POCT URINALYSIS DIP (DEVICE)
Bilirubin Urine: NEGATIVE
Glucose, UA: NEGATIVE mg/dL
Ketones, ur: NEGATIVE mg/dL
NITRITE: NEGATIVE
PH: 6 (ref 5.0–8.0)
PROTEIN: NEGATIVE mg/dL
SPECIFIC GRAVITY, URINE: 1.015 (ref 1.005–1.030)
UROBILINOGEN UA: 0.2 mg/dL (ref 0.0–1.0)

## 2017-07-15 MED ORDER — METRONIDAZOLE 500 MG PO TABS: 500.0000 mg | ORAL_TABLET | Freq: Two times a day (BID) | ORAL | 0 refills | Status: DC

## 2017-07-15 MED ORDER — CEPHALEXIN 500 MG PO CAPS: 500.0000 mg | ORAL_CAPSULE | Freq: Four times a day (QID) | ORAL | 0 refills | Status: DC

## 2017-07-15 NOTE — ED Triage Notes (Signed)
PT reports she was treated for trich last month. PT reports urinary frequency and vaginal discharge for 1 week.   PT also reports cold symptoms for 1 weeks.

## 2017-07-15 NOTE — ED Provider Notes (Signed)
  [facility]   [id number] 07/15/17 Arrival Time: 1107  ASSESSMENT & PLAN:  No diagnosis found.  No orders of the defined types were placed in this encounter.   Reviewed expectations re: course of current medical issues. Questions answered. Outlined signs and symptoms indicating need for more acute intervention. Patient verbalized understanding. After Visit Summary given.   SUBJECTIVE: History from: patient. Marlowe AltLatisha C Bibb is a 37 y.o. female who presents with complaint of persistent vaginal discharge and urinary frequency. Reports abrupt onset several days ago. Described symptoms have gradually worsened since beginning.  She was recently treated for trichomonas and she states the sx's have returned and he has been having urinary frequency.   ROS: As per HPI.   OBJECTIVE:  Vitals:   07/15/17 1142  BP: 121/71  Pulse: 89  Resp: 16  Temp: 97.8 F (36.6 C)  SpO2: 100%  Weight: 150 lb (68 kg)    General appearance: alert; no distress Eyes: PERRLA; EOMI; conjunctiva normal HENT: normocephalic; atraumatic; TMs normal; nasal mucosa normal; oral mucosa normal Neck: supple  Lungs: clear to auscultation bilaterally Heart: regular rate and rhythm Abdomen: soft, non-tender; bowel sounds normal; no masses or organomegaly; no guarding or rebound tenderness Back: no CVA tenderness Extremities: no cyanosis or edema; symmetrical with no gross deformities Skin: warm and dry Neurologic: normal gait; normal symmetric reflexes Psychological: alert and cooperative; normal mood and affect  Labs:  Labs Reviewed  POCT URINALYSIS DIP (DEVICE) - Abnormal; Notable for the following components:      Result Value   Hgb urine dipstick TRACE (*)    Leukocytes, UA SMALL (*)    All other components within normal limits  URINE CYTOLOGY ANCILLARY ONLY    Imaging: No results found.  No Known Allergies  Past Medical History:  Diagnosis Date  . Bronchitis    Social History    Socioeconomic History  . Marital status: Single    Spouse name: Not on file  . Number of children: Not on file  . Years of education: Not on file  . Highest education level: Not on file  Social Needs  . Financial resource strain: Not on file  . Food insecurity - worry: Not on file  . Food insecurity - inability: Not on file  . Transportation needs - medical: Not on file  . Transportation needs - non-medical: Not on file  Occupational History  . Not on file  Tobacco Use  . Smoking status: Current Every Day Smoker    Packs/day: 0.25    Types: Cigarettes  . Smokeless tobacco: Never Used  Substance and Sexual Activity  . Alcohol use: Yes    Comment: occ  . Drug use: No  . Sexual activity: Not on file  Other Topics Concern  . Not on file  Social History Narrative  . Not on file   Family History  Family history unknown: Yes   Past Surgical History:  Procedure Laterality Date  . TUBAL LIGATION       Deatra CanterOxford, Vester Titsworth J, OregonFNP 07/15/17 1217

## 2017-07-16 LAB — URINE CYTOLOGY ANCILLARY ONLY
Chlamydia: NEGATIVE
Neisseria Gonorrhea: NEGATIVE
Trichomonas: NEGATIVE

## 2017-07-17 LAB — URINE CULTURE: Culture: >=100000 — AB

## 2017-07-19 LAB — URINE CYTOLOGY ANCILLARY ONLY: Candida vaginitis: NEGATIVE

## 2017-12-09 ENCOUNTER — Encounter (HOSPITAL_COMMUNITY): Payer: Self-pay

## 2017-12-09 ENCOUNTER — Emergency Department (HOSPITAL_COMMUNITY): Payer: Medicaid Other

## 2017-12-09 ENCOUNTER — Other Ambulatory Visit: Payer: Self-pay

## 2017-12-09 ENCOUNTER — Emergency Department (HOSPITAL_COMMUNITY)
Admission: EM | Admit: 2017-12-09 | Discharge: 2017-12-09 | Disposition: A | Discharge status: Home / Self Care | Payer: Medicaid Other | Attending: Emergency Medicine | Admitting: Emergency Medicine

## 2017-12-09 ENCOUNTER — Emergency Department (HOSPITAL_COMMUNITY)
Admission: EM | Admit: 2017-12-09 | Discharge: 2017-12-09 | Disposition: A | Discharge status: Home / Self Care | Payer: Medicaid Other | Source: Home / Self Care | Attending: Emergency Medicine | Admitting: Emergency Medicine

## 2017-12-09 DIAGNOSIS — Y929 Unspecified place or not applicable: Secondary | ICD-10-CM | POA: Diagnosis not present

## 2017-12-09 DIAGNOSIS — W2209XA Striking against other stationary object, initial encounter: Secondary | ICD-10-CM | POA: Insufficient documentation

## 2017-12-09 DIAGNOSIS — S93401A Sprain of unspecified ligament of right ankle, initial encounter: Secondary | ICD-10-CM | POA: Diagnosis present

## 2017-12-09 DIAGNOSIS — H1033 Unspecified acute conjunctivitis, bilateral: Secondary | ICD-10-CM

## 2017-12-09 DIAGNOSIS — Z79899 Other long term (current) drug therapy: Secondary | ICD-10-CM | POA: Diagnosis not present

## 2017-12-09 DIAGNOSIS — F17210 Nicotine dependence, cigarettes, uncomplicated: Secondary | ICD-10-CM | POA: Insufficient documentation

## 2017-12-09 DIAGNOSIS — Y939 Activity, unspecified: Secondary | ICD-10-CM | POA: Diagnosis not present

## 2017-12-09 DIAGNOSIS — Y999 Unspecified external cause status: Secondary | ICD-10-CM | POA: Diagnosis not present

## 2017-12-09 DIAGNOSIS — S93409A Sprain of unspecified ligament of unspecified ankle, initial encounter: Secondary | ICD-10-CM

## 2017-12-09 MED ORDER — FLUORESCEIN SODIUM 1 MG OP STRP
1.0000 | ORAL_STRIP | Freq: Once | OPHTHALMIC | Status: AC
  Administered 2017-12-09: 1 via OPHTHALMIC
  Filled 2017-12-09: qty 1

## 2017-12-09 MED ORDER — CETIRIZINE HCL 10 MG PO TABS: 10.0000 mg | ORAL_TABLET | Freq: Every day | ORAL | 0 refills | Status: DC

## 2017-12-09 MED ORDER — TETRACAINE HCL 0.5 % OP SOLN
2.0000 [drp] | Freq: Once | OPHTHALMIC | Status: AC
  Administered 2017-12-09: 2 [drp] via OPHTHALMIC
  Filled 2017-12-09: qty 4

## 2017-12-09 MED ORDER — FLUTICASONE PROPIONATE 50 MCG/ACT NA SUSP: 1.0000 | Freq: Every day | NASAL | 0 refills | Status: DC

## 2017-12-09 NOTE — Discharge Instructions (Addendum)
Take ibuprofen 3 times a day with meals (800 mg, or 4 pills). OR you make take either naproxen or Aleve 2 times a day (2 pills at a time).  You may supplement with Tylenol if you need further pain control. Use ice packs, 20 minutes at a time, 3 times a day when able.  Elevate your foot when able.  Use the ASO brace to help with support and compression. The crutches are only as needed for comfort.  If you feel like he can walk without them, that would be better. Follow-up with your primary care doctor next week if your pain is not improving. Return to the emergency room if you develop numbness of your foot, inability to move your foot, or any new or concerning symptoms.

## 2017-12-09 NOTE — ED Triage Notes (Signed)
Pt states she was moving furniture yesterday and she hit her ankle. Pt reports swelling and bruising. Some relief with advil.

## 2017-12-09 NOTE — ED Notes (Signed)
Patient able to ambulate independently  

## 2017-12-09 NOTE — ED Provider Notes (Signed)
Patient placed in Quick Look pathway, seen and evaluated   Chief Complaint: blurry vision   HPI:   Becky Pham is a 37 y.o. female who presents to the ED with blurry vision and burning in both eyes. Patient reports she left the ED after being evaluated for ankle contusion and was getting on the burn when her symptoms started. Patient report she got home and took her contacts out but the burning and blurry vision continued. Patient reports she does not wear contact lenses often and had not had them in for 2 weeks before this morning.   ROS: Eyes: pain, change in vision.   Physical Exam:  BP (!) 120/92 (BP Location: Right Arm)   Pulse 75   Temp 98.4 F (36.9 C) (Oral)   Resp 14   LMP 11/28/2017 (Within Days) Comment: tubal ligation, per pt  SpO2 100%    Gen: No distress  Neuro: Awake and Alert  Skin: Warm and dry  Eyes: conjunctiva with erythema, eyes watering.     Initiation of care has begun. The patient has been counseled on the process, plan, and necessity for staying for the completion/evaluation, and the remainder of the medical screening examination    Janne Napoleoneese, Othon Guardia M, NP 12/09/17 1525    Rolland PorterJames, Mark, MD 12/09/17 2229

## 2017-12-09 NOTE — ED Provider Notes (Signed)
[facility]   CSN: [id number] Arrival date & time: 12/09/17  [id number]     History   Chief Complaint Chief Complaint  Patient presents with  . Ankle Pain    HPI Becky Pham is a 37 y.o. female presenting for evaluation of right ankle pain.  Patient states she was helping move a dresser yesterday afternoon, when she hit the dorsal lateral aspect of her right foot/ankle against the dresser.  She reports acute onset pain.  She has been using ibuprofen, Aleve, and naproxen with mild improvement of the pain.  She states that when she woke up this morning, she noticed worsening swelling, and wanted her ankle evaluated.  She has been able to ambulate, although states that this is very painful.  She denies numbness or tingling.  She is not on blood thinners.  She denies injury elsewhere.  She is able to move her foot, but this makes the pain worse.  There is no radiation of the pain.  HPI  Past Medical History:  Diagnosis Date  . Bronchitis     There are no active problems to display for this patient.   Past Surgical History:  Procedure Laterality Date  . TUBAL LIGATION       OB History   None      Home Medications    Prior to Admission medications   Medication Sig Start Date End Date Taking? Authorizing Provider  albuterol (PROVENTIL,VENTOLIN) 2 MG/5ML syrup Take 2 mg by mouth 3 (three) times daily.    [provider]  cephALEXin (KEFLEX) 500 MG capsule Take 1 capsule (500 mg total) by mouth 4 (four) times daily. 07/15/17   Deatra Canterxford, William J, FNP  famotidine (PEPCID) 20 MG tablet Take 1 tablet (20 mg total) by mouth 2 (two) times daily. 02/20/17   Fawze, Mina A, PA-C  metroNIDAZOLE (FLAGYL) 500 MG tablet Take 1 tablet (500 mg total) by mouth 2 (two) times daily. 03/29/17   Elvina SidleLauenstein, Kurt, MD  metroNIDAZOLE (FLAGYL) 500 MG tablet Take 1 tablet (500 mg total) by mouth 2 (two) times daily. 07/15/17   Deatra Canterxford, William J,  FNP  Multiple Vitamins-Minerals (CENTRUM ADULTS) TABS Take 1 tablet by mouth daily.    [provider]    Family History Family History  Family history unknown: Yes    Social History Social History   Tobacco Use  . Smoking status: Current Every Day Smoker    Packs/day: 0.25    Types: Cigarettes  . Smokeless tobacco: Never Used  Substance Use Topics  . Alcohol use: Yes    Comment: occ  . Drug use: No    Frequency: 1.0 times per week    Types: Marijuana     Allergies   Patient has no known allergies.   Review of Systems Review of Systems  Musculoskeletal: Positive for arthralgias and joint swelling.  Neurological: Negative for numbness.  Hematological: Does not bruise/bleed easily.     Physical Exam Updated Vital Signs BP 114/79 (BP Location: Right Arm)   Pulse 71   Temp 98.3 F (36.8 C) (Oral)   Resp 20   Ht 5\' 7"  (1.702 m)   Wt 72.6 kg (160 lb)   LMP 11/28/2017 (Within Days) Comment: tubal ligation, per pt  SpO2 99%   BMI 25.06 kg/m   Physical Exam  Constitutional: She is oriented to person, place, and time. She appears well-developed and well-nourished. No distress.  HENT:  Head: Normocephalic and atraumatic.  Eyes: EOM are normal.  Neck: Normal range of motion.  Pulmonary/Chest: Effort normal.  Abdominal: She exhibits no distension.  Musculoskeletal: Normal range of motion. She exhibits edema and tenderness.  Swelling noted of the dorsal lateral aspect of the right foot.  No obvious swelling over the malleolus.  Pedal pulses intact bilaterally.  Sensation intact bilaterally.  Good cap refill.  Full passive range of motion, increased pain with dorsiflexion and eversion.  Neurological: She is alert and oriented to person, place, and time. No sensory deficit.  Skin: Skin is warm. Capillary refill takes less than 2 seconds. No rash noted.  Psychiatric: She has a normal mood and affect.  Nursing note and vitals reviewed.    ED Treatments /  Results  Labs (all labs ordered are listed, but only abnormal results are displayed) Labs Reviewed - No data to display  EKG None  Radiology Dg Ankle Complete Right  Result Date: 12/09/2017 CLINICAL DATA:  Anterior right ankle pain and dorsal right foot pain after trauma. EXAM: RIGHT ANKLE - COMPLETE 3+ VIEW COMPARISON:  None. FINDINGS: There is no evidence of fracture, dislocation, or joint effusion. There is no evidence of arthropathy or other focal bone abnormality. Soft tissues are unremarkable. IMPRESSION: Negative. Electronically Signed   By: Gerome Sam III M.D   On: 12/09/2017 11:08   Dg Foot Complete Right  Result Date: 12/09/2017 CLINICAL DATA:  Pain after trauma EXAM: RIGHT FOOT COMPLETE - 3+ VIEW COMPARISON:  None. FINDINGS: Subtly altered contour of the distal second phalanx, probably developmental or from previous trauma. No acute fracture noted. IMPRESSION: No acute fracture noted. Electronically Signed   By: Gerome Sam III M.D   On: 12/09/2017 11:10    Procedures Procedures (including critical care time)  Medications Ordered in ED Medications - No data to display   Initial Impression / Assessment and Plan / ED Course  I have reviewed the triage vital signs and the nursing notes.  Pertinent labs & imaging results that were available during my care of the patient were reviewed by me and considered in my medical decision making (see chart for details).     Patient presenting for evaluation of right foot pain.  Physical exam reassuring, she is neurovascularly intact.  She has mild swelling and tenderness of the dorsal lateral foot.  Will obtain x-ray of ankle and foot to assess bony stability.  X-rays viewed and interpreted by me, no fracture dislocation.  Likely muscle contusion vs ankle sprain.  Discussed with patient.  Discussed symptomatic treatment with NSAIDs, and ASO brace.  Patient requesting crutches, as she has a lot to do today which involves a lot of  walking.  Will give crutches for comfort.  Patient to follow-up with PCP if symptoms are not improving.  At this time, patient appears safe for discharge.  Return precautions given.  Patient states she understands and agrees to plan.   Final Clinical Impressions(s) / ED Diagnoses   Final diagnoses:  Sprain of ankle, unspecified laterality, unspecified ligament, initial encounter    ED Discharge Orders    None       Alveria Apley, PA-C 12/09/17 1123    Vanetta Mulders, MD 12/13/17 1844

## 2017-12-09 NOTE — ED Notes (Signed)
Patient updated on delay for ortho tech, provide with OJ and graham crackers and peanut butter.  Patient's family provided with items as well

## 2017-12-09 NOTE — ED Notes (Signed)
Patient able to ambulate independently with crutches 

## 2017-12-09 NOTE — ED Provider Notes (Signed)
[facility] EMERGENCY DEPARTMENT Provider Note   CSN: [id number] Arrival date & time: 12/09/17  1504     History   Chief Complaint Chief Complaint  Patient presents with  . Eye Pain    HPI Becky Pham is a 37 y.o. female presenting for evaluation of bilateral eye irritation and nasal congestion.  Patient states she was seen here earlier in the ER this morning for an ankle sprain.  She was leaving, and start to notice some nasal congestion.  While she was waiting for the bus, she developed bilateral eye irritation and tearing.  By the time she got home, to take her contacts out, she had significant bilateral eye tearing.  She states that since then, she has been unable to open her eyes due to the burning sensation.  She denies difficulty moving her eyes or pain behind her eyes.  She denies fevers, chills, ear pain, sore throat, cough.  Patient wears contacts, wore them for the first time recently today.  She does not sleep with her contacts in. She denies anything getting into her eyes.  HPI  Past Medical History:  Diagnosis Date  . Bronchitis     There are no active problems to display for this patient.   Past Surgical History:  Procedure Laterality Date  . TUBAL LIGATION       OB History   None      Home Medications    Prior to Admission medications   Medication Sig Start Date End Date Taking? Authorizing Provider  cetirizine (ZYRTEC) 10 MG tablet Take 1 tablet (10 mg total) by mouth daily. 12/09/17   Vitalia Stough, PA-C  famotidine (PEPCID) 20 MG tablet Take 1 tablet (20 mg total) by mouth 2 (two) times daily. Patient not taking: Reported on 12/09/2017 02/20/17   Michela Pitcher A, PA-C  fluticasone (FLONASE) 50 MCG/ACT nasal spray Place 1 spray into both nostrils daily. 12/09/17   Betzayda Braxton, PA-C  naproxen sodium (ALEVE) 220 MG tablet Take 220-440 mg by mouth daily as needed (pain).    [provider]    Family History Family  History  Family history unknown: Yes    Social History Social History   Tobacco Use  . Smoking status: Current Every Day Smoker    Packs/day: 0.25    Types: Cigarettes  . Smokeless tobacco: Never Used  Substance Use Topics  . Alcohol use: Yes    Comment: occ  . Drug use: No    Frequency: 1.0 times per week    Types: Marijuana     Allergies   Patient has no known allergies.   Review of Systems Review of Systems  Constitutional: Negative for fever.  HENT: Positive for congestion.   Eyes: Positive for discharge, redness and itching.  Respiratory: Negative for cough.      Physical Exam Updated Vital Signs BP (!) 120/92 (BP Location: Right Arm)   Pulse 75   Temp 98.4 F (36.9 C) (Oral)   Resp 14   LMP 11/28/2017 (Within Days) Comment: tubal ligation, per pt  SpO2 100%   Physical Exam  Constitutional: She is oriented to person, place, and time. She appears well-developed and well-nourished. No distress.  Appears in no distress  HENT:  Head: Normocephalic and atraumatic.  Right Ear: Tympanic membrane, external ear and ear canal normal.  Left Ear: Tympanic membrane, external ear and ear canal normal.  Nose: Mucosal edema and rhinorrhea present. Right sinus exhibits frontal sinus tenderness. Right sinus  exhibits no maxillary sinus tenderness. Left sinus exhibits frontal sinus tenderness. Left sinus exhibits no maxillary sinus tenderness.  Mouth/Throat: Uvula is midline, oropharynx is clear and moist and mucous membranes are normal.  Eyes: Pupils are equal, round, and reactive to light. EOM and lids are normal. Right conjunctiva is injected. Right conjunctiva has no hemorrhage. Left conjunctiva is injected. Left conjunctiva has no hemorrhage.  Bilateral conjunctival injection.  EOMI and PERRLA.  No nystagmus.  Tono-Pen 16 on the right, 20 on the left.  Visual acuity not able to be obtained, as patient is not wearing her corrective lenses at this time.  Equal bilaterally.   Clear drainage noted coming from both eyes.  No chemosis or significant swelling  Neck: Normal range of motion.  Cardiovascular: Normal rate, regular rhythm and intact distal pulses.  Pulmonary/Chest: Effort normal and breath sounds normal. No respiratory distress. She has no wheezes.  Abdominal: Soft. She exhibits no distension. There is no tenderness.  Musculoskeletal: Normal range of motion.  Neurological: She is alert and oriented to person, place, and time.  Skin: Skin is warm. Capillary refill takes less than 2 seconds. No rash noted.  Psychiatric: She has a normal mood and affect.  Nursing note and vitals reviewed.    ED Treatments / Results  Labs (all labs ordered are listed, but only abnormal results are displayed) Labs Reviewed - No data to display  EKG None  Radiology Dg Ankle Complete Right  Result Date: 12/09/2017 CLINICAL DATA:  Anterior right ankle pain and dorsal right foot pain after trauma. EXAM: RIGHT ANKLE - COMPLETE 3+ VIEW COMPARISON:  None. FINDINGS: There is no evidence of fracture, dislocation, or joint effusion. There is no evidence of arthropathy or other focal bone abnormality. Soft tissues are unremarkable. IMPRESSION: Negative. Electronically Signed   By: Gerome Samavid  Williams III M.D   On: 12/09/2017 11:08   Dg Foot Complete Right  Result Date: 12/09/2017 CLINICAL DATA:  Pain after trauma EXAM: RIGHT FOOT COMPLETE - 3+ VIEW COMPARISON:  None. FINDINGS: Subtly altered contour of the distal second phalanx, probably developmental or from previous trauma. No acute fracture noted. IMPRESSION: No acute fracture noted. Electronically Signed   By: Gerome Samavid  Williams III M.D   On: 12/09/2017 11:10    Procedures Procedures (including critical care time)  Medications Ordered in ED Medications  tetracaine (PONTOCAINE) 0.5 % ophthalmic solution 2 drop (2 drops Both Eyes Given 12/09/17 1537)  fluorescein ophthalmic strip 1 strip (1 strip Both Eyes Given 12/09/17 1537)      Initial Impression / Assessment and Plan / ED Course  I have reviewed the triage vital signs and the nursing notes.  Pertinent labs & imaging results that were available during my care of the patient were reviewed by me and considered in my medical decision making (see chart for details).     Patient presenting for evaluation of acute onset bilateral eye irritation and nasal congestion.  Physical exam history of conjunctivitis.  As it is bilateral and acute onset with nasal congestion, doubt bacterial conjunctivitis.  Will treat symptomatically.  Tono-Pen exam reassuring, visual acuity unable to be obtained due to lack of corrective lenses.  Patient to follow-up with ophthalmology as needed if symptoms do not improve.  Discussed with patient.  At this time, patient appears safe for discharge.  Return precautions given.  Patient states she understands and agrees to plan.   Final Clinical Impressions(s) / ED Diagnoses   Final diagnoses:  Acute conjunctivitis of both eyes,  unspecified acute conjunctivitis type    ED Discharge Orders        Ordered    fluticasone (FLONASE) 50 MCG/ACT nasal spray  Daily     12/09/17 1616    cetirizine (ZYRTEC) 10 MG tablet  Daily     12/09/17 1616       [place], [place], PA-C 12/09/17 Arlice Colt, MD 12/10/17 1920

## 2017-12-09 NOTE — ED Notes (Signed)
Patient transported to XR. 

## 2017-12-09 NOTE — Discharge Instructions (Signed)
Your eye irritation is likely either allergic or viral.  Both should resolve with time and with symptom control. Use Flonase for nasal congestion. Use Zyrtec for allergy symptoms consistent with eye irritation, watering, and nasal congestion. Do not itch, irritate, or scratch your eyes.  I do not recommend that you use your contacts until your symptoms completely resolve. Follow-up with your eye doctor next week if your symptoms are not improving. Return to the emergency room if you develop inability to move your eyes, your eyes start swelling and bulging, or with any new or concerning symptoms.

## 2017-12-09 NOTE — ED Triage Notes (Signed)
Pt presents with bilateral eye pain that began while waiting for bus after being discharged from here this morning with ankle pain.  Pt has not had contacts in x 2 weeks, took them out when she got home but continues to have watery, itchy eyes.

## 2017-12-13 ENCOUNTER — Encounter (HOSPITAL_COMMUNITY): Payer: Self-pay | Admitting: Emergency Medicine

## 2017-12-13 ENCOUNTER — Other Ambulatory Visit: Payer: Self-pay

## 2017-12-13 ENCOUNTER — Ambulatory Visit (HOSPITAL_COMMUNITY)
Admission: EM | Admit: 2017-12-13 | Discharge: 2017-12-13 | Disposition: A | Discharge status: Home / Self Care | Payer: Medicaid Other | Attending: Family Medicine | Admitting: Family Medicine

## 2017-12-13 DIAGNOSIS — N898 Other specified noninflammatory disorders of vagina: Secondary | ICD-10-CM | POA: Diagnosis not present

## 2017-12-13 DIAGNOSIS — Z711 Person with feared health complaint in whom no diagnosis is made: Secondary | ICD-10-CM

## 2017-12-13 DIAGNOSIS — R350 Frequency of micturition: Secondary | ICD-10-CM | POA: Diagnosis not present

## 2017-12-13 DIAGNOSIS — F17210 Nicotine dependence, cigarettes, uncomplicated: Secondary | ICD-10-CM | POA: Diagnosis not present

## 2017-12-13 DIAGNOSIS — Z79899 Other long term (current) drug therapy: Secondary | ICD-10-CM | POA: Diagnosis not present

## 2017-12-13 DIAGNOSIS — Z8709 Personal history of other diseases of the respiratory system: Secondary | ICD-10-CM | POA: Insufficient documentation

## 2017-12-13 DIAGNOSIS — Z9851 Tubal ligation status: Secondary | ICD-10-CM | POA: Insufficient documentation

## 2017-12-13 DIAGNOSIS — Z113 Encounter for screening for infections with a predominantly sexual mode of transmission: Secondary | ICD-10-CM

## 2017-12-13 HISTORY — DX: Unspecified asthma, uncomplicated: J45.909

## 2017-12-13 LAB — POCT URINALYSIS DIP (DEVICE)
BILIRUBIN URINE: NEGATIVE
Glucose, UA: NEGATIVE mg/dL
KETONES UR: NEGATIVE mg/dL
NITRITE: NEGATIVE
PH: 5.5 (ref 5.0–8.0)
Protein, ur: NEGATIVE mg/dL
Specific Gravity, Urine: 1.02 (ref 1.005–1.030)
Urobilinogen, UA: 0.2 mg/dL (ref 0.0–1.0)

## 2017-12-13 NOTE — ED Notes (Signed)
Sent for a dirty and clean urine specimen with instructions 

## 2017-12-13 NOTE — Discharge Instructions (Signed)
Urine not conclusive for infection, will send for urine culture. Testing sent, you will be contacted with any positive results that requires further treatment. Refrain from sexual activity and alcohol use for the next 7 days. Monitor for any worsening of symptoms, fever, abdominal pain, nausea, vomiting, to follow up for reevaluation.

## 2017-12-13 NOTE — ED Triage Notes (Signed)
Denies symptoms, says she has a new partner and wants to make sure she has no issues

## 2017-12-13 NOTE — ED Provider Notes (Signed)
[facility]    CSN: [id number] Arrival date & time: 12/13/17  1323     History   Chief Complaint Chief Complaint  Patient presents with  . SEXUALLY TRANSMITTED DISEASE    HPI Becky Pham is a 37 y.o. female.   37 year old female comes in for STD testing.  States recently started with a new partner.  She has some urinary frequency without dysuria, urgency, hematuria.  States intermittent abdominal pain that has since resolved.  Denies nausea, vomiting.  Denies fever, chills, night sweats.  No obvious vaginal discharge, but does have slight odor.  Denies spotting.  Currently sexually active with one female partner, no condom use.  LMP 11/28/2017.  History of tubal ligation.     Past Medical History:  Diagnosis Date  . Asthma   . Bronchitis     There are no active problems to display for this patient.   Past Surgical History:  Procedure Laterality Date  . TUBAL LIGATION      OB History   None      Home Medications    Prior to Admission medications   Medication Sig Start Date End Date Taking? Authorizing Provider  cetirizine (ZYRTEC) 10 MG tablet Take 1 tablet (10 mg total) by mouth daily. 12/09/17   [place], Sophia, PA-C    Family History Family History  Family history unknown: Yes    Social History Social History   Tobacco Use  . Smoking status: Current Every Day Smoker    Packs/day: 0.25    Types: Cigarettes  . Smokeless tobacco: Never Used  Substance Use Topics  . Alcohol use: Yes    Comment: occ  . Drug use: No    Frequency: 1.0 times per week    Types: Marijuana     Allergies   Patient has no known allergies.   Review of Systems Review of Systems  Reason unable to perform ROS: See HPI as above.     Physical Exam Triage Vital Signs ED Triage Vitals  Enc Vitals Group     BP 12/13/17 1403 119/78     Pulse Rate 12/13/17 1403 80     Resp 12/13/17 1403 18     Temp 12/13/17 1403 98.3 F (36.8 C)     Temp Source  12/13/17 1403 Oral     SpO2 12/13/17 1403 99 %     Weight --      Height --      Head Circumference --      Peak Flow --      Pain Score 12/13/17 1359 0     Pain Loc --      Pain Edu? --      Excl. in GC? --    No data found.  Updated Vital Signs BP 119/78 (BP Location: Left Arm)   Pulse 80   Temp 98.3 F (36.8 C) (Oral)   Resp 18   LMP 11/28/2017 (Within Days) Comment: tubal ligation, per pt  SpO2 99%   Physical Exam  Constitutional: She is oriented to person, place, and time. She appears well-developed and well-nourished. No distress.  HENT:  Head: Normocephalic and atraumatic.  Eyes: Pupils are equal, round, and reactive to light. Conjunctivae are normal.  Cardiovascular: Normal rate, regular rhythm and normal heart sounds. Exam reveals no gallop and no friction rub.  No murmur heard. Pulmonary/Chest: Effort normal and breath sounds normal. She has no wheezes. She has no rales.  Abdominal: Soft. Bowel sounds are normal. There  is no CVA tenderness.  Exam limited, patient with ankle injury on crutches, unable to ambulate onto exam table.  Abdominal exam with patient sitting upright, without obvious tenderness, guarding, rebound.  Neurological: She is alert and oriented to person, place, and time.  Skin: Skin is warm and dry.  Psychiatric: She has a normal mood and affect. Her behavior is normal. Judgment normal.     UC Treatments / Results  Labs (all labs ordered are listed, but only abnormal results are displayed) Labs Reviewed  POCT URINALYSIS DIP (DEVICE) - Abnormal; Notable for the following components:      Result Value   Hgb urine dipstick TRACE (*)    Leukocytes, UA TRACE (*)    All other components within normal limits  URINE CULTURE  HIV ANTIBODY (ROUTINE TESTING)  RPR  URINE CYTOLOGY ANCILLARY ONLY    EKG None  Radiology No results found.  Procedures Procedures (including critical care time)  Medications Ordered in UC Medications - No data to  display  Initial Impression / Assessment and Plan / UC Course  I have reviewed the triage vital signs and the nursing notes.  Pertinent labs & imaging results that were available during my care of the patient were reviewed by me and considered in my medical decision making (see chart for details).    Urine with trace leukocytes, will send for urine culture prior to treatment. Cytology sent, patient will be contacted with any positive results that require additional treatment. Patient to refrain from sexual activity for the next 7 days. Return precautions given. Patient expresses understanding and agrees to plan.  Final Clinical Impressions(s) / UC Diagnoses   Final diagnoses:  Concern about STD in female without diagnosis    ED Prescriptions    None        Belinda FisherYu, Amy V, PA-C 12/13/17 1603

## 2017-12-14 ENCOUNTER — Telehealth (HOSPITAL_COMMUNITY): Payer: Self-pay

## 2017-12-14 LAB — HIV ANTIBODY (ROUTINE TESTING W REFLEX): HIV SCREEN 4TH GENERATION: NONREACTIVE

## 2017-12-14 LAB — URINE CYTOLOGY ANCILLARY ONLY
Chlamydia: NEGATIVE
Neisseria Gonorrhea: NEGATIVE
TRICH (WINDOWPATH): POSITIVE — AB

## 2017-12-14 LAB — URINE CULTURE

## 2017-12-14 LAB — RPR: RPR Ser Ql: NONREACTIVE

## 2017-12-14 MED ORDER — METRONIDAZOLE 500 MG PO TABS: 500.0000 mg | ORAL_TABLET | Freq: Two times a day (BID) | ORAL | 0 refills | Status: DC

## 2017-12-14 NOTE — Telephone Encounter (Signed)
Trichomonas is positive. Rx metronidazole 500mg bid x 7d #14 no refills was sent to the pharmacy of record. PT called and made aware.  Educated patient to refrain from sexual intercourse for 7 days to give the medicine time to work. Sexual partners need to be notified and tested/treated. Condoms may reduce risk of reinfection.  Recheck for further evaluation if symptoms are not improving. Pt verbalized understanding.   

## 2017-12-15 LAB — URINE CYTOLOGY ANCILLARY ONLY: CANDIDA VAGINITIS: NEGATIVE

## 2017-12-17 ENCOUNTER — Telehealth (HOSPITAL_COMMUNITY): Payer: Self-pay

## 2017-12-17 NOTE — Telephone Encounter (Signed)
Bacterial Vaginosis test is positive.  Prescription for metronidazole was given at the urgent care visit. Pt contacted regarding results. Answered all questions. Verbalized understanding.   

## 2019-07-05 ENCOUNTER — Other Ambulatory Visit: Payer: Self-pay

## 2019-07-05 ENCOUNTER — Encounter (HOSPITAL_COMMUNITY): Payer: Self-pay

## 2019-07-05 ENCOUNTER — Ambulatory Visit (HOSPITAL_COMMUNITY)
Admission: EM | Admit: 2019-07-05 | Discharge: 2019-07-05 | Disposition: A | Discharge status: Home / Self Care | Payer: Medicaid Other

## 2019-07-05 DIAGNOSIS — Z0130 Encounter for examination of blood pressure without abnormal findings: Secondary | ICD-10-CM | POA: Diagnosis not present

## 2019-07-05 NOTE — ED Provider Notes (Signed)
  [facility]   MRN: [id number] DOB: 1981-02-21  Subjective:   Becky Pham is a 39 y.o. female presenting for blood pressure check.  She was at her PCPs office last week and had a high blood pressure reading.  Patient states that she was very stressed that day as she had to go to court for her son.  She was advised to come have it checked again and to have [place] fax them results.  No current facility-administered medications for this encounter.  Current Outpatient Medications:  .  cetirizine (ZYRTEC) 10 MG tablet, Take 1 tablet (10 mg total) by mouth daily., Disp: 30 tablet, Rfl: 0 .  metroNIDAZOLE (FLAGYL) 500 MG tablet, Take 1 tablet (500 mg total) by mouth 2 (two) times daily., Disp: 14 tablet, Rfl: 0   No Known Allergies  Past Medical History:  Diagnosis Date  . Asthma   . Bronchitis      Past Surgical History:  Procedure Laterality Date  . TUBAL LIGATION      Family History  Family history unknown: Yes    Social History   Tobacco Use  . Smoking status: Current Every Day Smoker    Packs/day: 0.25    Types: Cigarettes  . Smokeless tobacco: Never Used  Substance Use Topics  . Alcohol use: Yes    Comment: occ  . Drug use: No    Frequency: 1.0 times per week    Types: Marijuana    ROS   Objective:   Vitals: BP 124/70 (BP Location: Right Arm)   Pulse 79   Resp 16   Wt 165 lb (74.8 kg)   LMP 06/17/2019   SpO2 100%   BMI 25.84 kg/m   Physical Exam Constitutional:      General: She is not in acute distress.    Appearance: Normal appearance. She is well-developed. She is not ill-appearing, toxic-appearing or diaphoretic.  HENT:     Head: Normocephalic and atraumatic.     Nose: Nose normal.     Mouth/Throat:     Mouth: Mucous membranes are moist.     Pharynx: Oropharynx is clear.  Eyes:     General: No scleral icterus.    Extraocular Movements: Extraocular movements intact.     Pupils: Pupils are equal, round, and reactive to light.   Cardiovascular:     Rate and Rhythm: Normal rate.  Pulmonary:     Effort: Pulmonary effort is normal.  Skin:    General: Skin is warm and dry.  Neurological:     General: No focal deficit present.     Mental Status: She is alert and oriented to person, place, and time.  Psychiatric:        Mood and Affect: Mood normal.        Behavior: Behavior normal.      Assessment and Plan :   1. Blood pressure check     Patient's vital signs are very stable, normal.  This blood pressure check was faxed to her PCP at [facility].  Follow-up with [place] as needed.   Wallis Bamberg, PA-C 07/05/19 1356

## 2019-07-05 NOTE — ED Triage Notes (Signed)
Pt states she needs her b/p assets today. Pt states she needs the reading fax to a rehab. [phone] [facility].

## 2019-07-19 ENCOUNTER — Encounter (HOSPITAL_COMMUNITY): Payer: Self-pay | Admitting: Emergency Medicine

## 2019-07-19 ENCOUNTER — Emergency Department (HOSPITAL_COMMUNITY)
Admission: EM | Admit: 2019-07-19 | Discharge: 2019-07-19 | Disposition: A | Discharge status: Home / Self Care | Payer: Medicaid Other | Attending: Emergency Medicine | Admitting: Emergency Medicine

## 2019-07-19 ENCOUNTER — Other Ambulatory Visit: Payer: Self-pay

## 2019-07-19 DIAGNOSIS — Z79899 Other long term (current) drug therapy: Secondary | ICD-10-CM | POA: Diagnosis not present

## 2019-07-19 DIAGNOSIS — A5901 Trichomonal vulvovaginitis: Secondary | ICD-10-CM | POA: Insufficient documentation

## 2019-07-19 DIAGNOSIS — N898 Other specified noninflammatory disorders of vagina: Secondary | ICD-10-CM

## 2019-07-19 DIAGNOSIS — J45909 Unspecified asthma, uncomplicated: Secondary | ICD-10-CM | POA: Diagnosis not present

## 2019-07-19 DIAGNOSIS — A599 Trichomoniasis, unspecified: Secondary | ICD-10-CM

## 2019-07-19 DIAGNOSIS — R1030 Lower abdominal pain, unspecified: Secondary | ICD-10-CM | POA: Insufficient documentation

## 2019-07-19 DIAGNOSIS — F17210 Nicotine dependence, cigarettes, uncomplicated: Secondary | ICD-10-CM | POA: Insufficient documentation

## 2019-07-19 LAB — CBC
HCT: 40.1 % (ref 36.0–46.0)
Hemoglobin: 12.9 g/dL (ref 12.0–15.0)
MCH: 27.1 pg (ref 26.0–34.0)
MCHC: 32.2 g/dL (ref 30.0–36.0)
MCV: 84.2 fL (ref 80.0–100.0)
Platelets: 208 10*3/uL (ref 150–400)
RBC: 4.76 MIL/uL (ref 3.87–5.11)
RDW: 15.5 % (ref 11.5–15.5)
WBC: 5 10*3/uL (ref 4.0–10.5)
nRBC: 0 % (ref 0.0–0.2)

## 2019-07-19 LAB — I-STAT BETA HCG BLOOD, ED (MC, WL, AP ONLY): I-stat hCG, quantitative: <5 m[IU]/mL (ref <5)

## 2019-07-19 LAB — COMPREHENSIVE METABOLIC PANEL
ALT: 13 U/L (ref 0–44)
AST: 17 U/L (ref 15–41)
Albumin: 3.8 g/dL (ref 3.5–5.0)
Alkaline Phosphatase: 45 U/L (ref 38–126)
Anion gap: 7 (ref 5–15)
BUN: 8 mg/dL (ref 6–20)
CO2: 28 mmol/L (ref 22–32)
Calcium: 8.8 mg/dL — ABNORMAL LOW (ref 8.9–10.3)
Chloride: 106 mmol/L (ref 98–111)
Creatinine, Ser: 0.83 mg/dL (ref 0.44–1.00)
GFR calc Af Amer: >60 mL/min (ref >60)
GFR calc non Af Amer: >60 mL/min (ref >60)
Glucose, Bld: 80 mg/dL (ref 70–99)
Potassium: 3.9 mmol/L (ref 3.5–5.1)
Sodium: 141 mmol/L (ref 135–145)
Total Bilirubin: 0.8 mg/dL (ref 0.3–1.2)
Total Protein: 6 g/dL — ABNORMAL LOW (ref 6.5–8.1)

## 2019-07-19 LAB — WET PREP, GENITAL
Sperm: NONE SEEN
Yeast Wet Prep HPF POC: NONE SEEN

## 2019-07-19 LAB — URINALYSIS, ROUTINE W REFLEX MICROSCOPIC
Bilirubin Urine: NEGATIVE
Glucose, UA: NEGATIVE mg/dL
Hgb urine dipstick: NEGATIVE
Ketones, ur: NEGATIVE mg/dL
Leukocytes,Ua: NEGATIVE
Nitrite: NEGATIVE
Protein, ur: NEGATIVE mg/dL
Specific Gravity, Urine: 1.013 (ref 1.005–1.030)
pH: 6 (ref 5.0–8.0)

## 2019-07-19 LAB — HIV ANTIBODY (ROUTINE TESTING W REFLEX): HIV Screen 4th Generation wRfx: NONREACTIVE

## 2019-07-19 LAB — LIPASE, BLOOD: Lipase: 22 U/L (ref 11–51)

## 2019-07-19 MED ORDER — METRONIDAZOLE 500 MG PO TABS
2000.0000 mg | ORAL_TABLET | Freq: Once | ORAL | Status: AC
  Administered 2019-07-19: 13:00:00 2000 mg via ORAL
  Filled 2019-07-19: qty 4

## 2019-07-19 MED ORDER — STERILE WATER FOR INJECTION IJ SOLN
INTRAMUSCULAR | Status: AC
  Administered 2019-07-19: 13:00:00 1 mL
  Filled 2019-07-19: qty 10

## 2019-07-19 MED ORDER — CEFTRIAXONE SODIUM 500 MG IJ SOLR
500.0000 mg | Freq: Once | INTRAMUSCULAR | Status: AC
  Administered 2019-07-19: 13:00:00 500 mg via INTRAMUSCULAR
  Filled 2019-07-19: qty 500

## 2019-07-19 MED ORDER — SODIUM CHLORIDE 0.9% FLUSH
3.0000 mL | Freq: Once | INTRAVENOUS | Status: AC
  Administered 2019-07-19: 12:00:00 3 mL via INTRAVENOUS

## 2019-07-19 NOTE — ED Triage Notes (Addendum)
Pt arrives to ED from [facility] with complaints of chronic R & L lower abdominal pain for the last couple of years. Patient states she has no pain at this time but wants to get checked out because she has not seen a PCP in 3 years.

## 2019-07-19 NOTE — ED Notes (Signed)
Pt provided with [employer]

## 2019-07-19 NOTE — ED Provider Notes (Signed)
[facility] EMERGENCY DEPARTMENT Provider Note   CSN: [id number] Arrival date & time: 07/19/19  4098     History Chief Complaint  Patient presents with  . Abdominal Pain    Becky Pham is a 39 y.o. female.  Becky Pham is a 39 y.o. female with a history of asthma, bronchitis and substance abuse, who presents to the ED from [facility] rehab facility with complaints of chronic intermittent right lower abdominal pain.  She reports she has been experiencing this pain off and on for the past few years, but because of her substance abuse history she did not follow-up with PCP regarding this.  She attempted to make an appointment with her old PCP but has not been seen there in over 3 years so we will have to be seen as a new patient.  She reports this past Friday at the end of her menstrual cycle she had an episode of severe lower abdominal pain that lasted about a day.  She denies any associated vomiting or diarrhea, no fevers or chills and pain has resolved.  She states she has no lower abdominal pain at all today but wanted to come in to be evaluated.  Her [facility] rehab facility said that she needed to be medically cleared prior to return.  She does report that she has had some vaginal discharge and is previously been diagnosed with trichomoniasis.  No vaginal bleeding outside of her regular menstrual cycle.  No dysuria or urinary frequency.  No other aggravating or alleviating factors.        Past Medical History:  Diagnosis Date  . Asthma   . Bronchitis     There are no problems to display for this patient.   Past Surgical History:  Procedure Laterality Date  . TUBAL LIGATION       OB History   No obstetric history on file.     Family History  Family history unknown: Yes    Social History   Tobacco Use  . Smoking status: Current Every Day Smoker    Packs/day: 0.25    Types: Cigarettes  . Smokeless tobacco: Never Used  Substance Use Topics  .  Alcohol use: Yes    Comment: occ  . Drug use: No    Frequency: 1.0 times per week    Types: Marijuana    Home Medications Prior to Admission medications   Medication Sig Start Date End Date Taking? Authorizing Provider  cetirizine (ZYRTEC) 10 MG tablet Take 1 tablet (10 mg total) by mouth daily. 12/09/17   Caccavale, Sophia, PA-C  metroNIDAZOLE (FLAGYL) 500 MG tablet Take 1 tablet (500 mg total) by mouth 2 (two) times daily. 12/14/17   Wynona Luna, MD    Allergies    Patient has no known allergies.  Review of Systems   Review of Systems  Constitutional: Negative for chills and fever.  HENT: Negative.   Respiratory: Negative for cough and shortness of breath.   Cardiovascular: Negative for chest pain.  Gastrointestinal: Positive for abdominal pain. Negative for blood in stool, constipation, diarrhea, nausea and vomiting.  Genitourinary: Positive for vaginal discharge. Negative for dysuria, frequency, pelvic pain and vaginal bleeding.  Musculoskeletal: Negative for arthralgias and myalgias.  All other systems reviewed and are negative.   Physical Exam Updated Vital Signs BP 124/88 (BP Location: Right Arm)   Pulse 69   Temp 98.5 F (36.9 C) (Oral)   Resp 14   SpO2 100%   Physical Exam  Vitals and nursing note reviewed. Exam conducted with a chaperone present.  Constitutional:      General: She is not in acute distress.    Appearance: She is well-developed and normal weight. She is not ill-appearing or diaphoretic.  HENT:     Head: Normocephalic and atraumatic.  Eyes:     General:        Right eye: No discharge.        Left eye: No discharge.     Pupils: Pupils are equal, round, and reactive to light.  Cardiovascular:     Rate and Rhythm: Normal rate and regular rhythm.     Heart sounds: Normal heart sounds. No murmur. No friction rub. No gallop.   Pulmonary:     Effort: Pulmonary effort is normal. No respiratory distress.     Breath sounds: Normal breath  sounds. No wheezing or rales.     Comments: Respirations equal and unlabored, patient able to speak in full sentences, lungs clear to auscultation bilaterally Abdominal:     General: Bowel sounds are normal. There is no distension.     Palpations: Abdomen is soft. There is no mass.     Tenderness: There is no abdominal tenderness. There is no guarding.     Comments: Abdomen soft, nondistended, nontender to palpation in all quadrants without guarding or peritoneal signs  Genitourinary:    Comments: Chaperone present during pelvic exam No external genital lesions noted. On speculum exam there is a moderate amount of white discharge present, no bleeding, no cervical lesions. On bimanual exam there is no cervical motion tenderness, no adnexal tenderness or masses. Musculoskeletal:        General: No deformity.     Cervical back: Neck supple.  Skin:    General: Skin is warm and dry.     Capillary Refill: Capillary refill takes less than 2 seconds.  Neurological:     Mental Status: She is alert.     Coordination: Coordination normal.     Comments: Speech is clear, able to follow commands Moves extremities without ataxia, coordination intact  Psychiatric:        Mood and Affect: Mood normal.        Behavior: Behavior normal.     ED Results / Procedures / Treatments   Labs (all labs ordered are listed, but only abnormal results are displayed) Labs Reviewed  WET PREP, GENITAL - Abnormal; Notable for the following components:      Result Value   Trich, Wet Prep PRESENT (*)    Clue Cells Wet Prep HPF POC PRESENT (*)    WBC, Wet Prep HPF POC MANY (*)    All other components within normal limits  COMPREHENSIVE METABOLIC PANEL - Abnormal; Notable for the following components:   Calcium 8.8 (*)    Total Protein 6.0 (*)    All other components within normal limits  LIPASE, BLOOD  CBC  URINALYSIS, ROUTINE W REFLEX MICROSCOPIC  HIV ANTIBODY (ROUTINE TESTING W REFLEX)  RPR  I-STAT BETA  HCG BLOOD, ED ([facility], [facility], [facility] ONLY)  GC/CHLAMYDIA PROBE AMP ([place]) NOT AT [facility]    EKG None  Radiology No results found.  Procedures Procedures (including critical care time)  Medications Ordered in ED Medications  sodium chloride flush (NS) 0.9 % injection 3 mL (3 mLs Intravenous Given 07/19/19 1131)  metroNIDAZOLE (FLAGYL) tablet 2,000 mg (2,000 mg Oral Given 07/19/19 1315)  cefTRIAXone (ROCEPHIN) injection 500 mg (500 mg Intramuscular Given 07/19/19 1316)  sterile  water (preservative free) injection (1 mL  Given 07/19/19 1316)    ED Course  I have reviewed the triage vital signs and the nursing notes.  Pertinent labs & imaging results that were available during my care of the patient were reviewed by me and considered in my medical decision making (see chart for details).    MDM Rules/Calculators/A&P                     40 year old female presents for evaluation of intermittent abdominal pains over the last few years, she has been experiencing some vaginal discharge after her most recent menstrual cycle.  Patient needs medical clearance before she can return to [facility] rehab center.  On arrival she has normal vitals and is well-appearing.  She has no abdominal tenderness and has not been experiencing any persistent vomiting or diarrhea.  She does not have any urinary symptoms.  Pelvic exam with some discharge present but no uterine or adnexal tenderness to suggest pelvic inflammatory disease.  STD testing collected as well as abdominal labs.  Patient's lab work is very reassuring she has no leukocytosis and normal hemoglobin, no electrolyte derangements that would require intervention, normal renal and liver function and normal lipase.  Urinalysis without signs of infection.  HIV is nonreactive.  Patient's wet prep is positive for trichomonas and also has WBCs and clue cells present.  I discussed this with the patient, will treat with Flagyl for trichomonas today, patient would also like  to be prophylactically treated for gonorrhea and will be prescribed a prescription for doxycycline, has been instructed to wait for the results of her chlamydia test if positive she will take doxycycline prescription as prescribed otherwise she will not need this antibiotic.  I feel patient's intermittent abdominal pain is likely chronic, and she has no abdominal tenderness on exam today to suggest acute intra-abdominal pathology that would require emergent surgery or further evaluation.  At this time there does not appear to be any evidence of an acute emergency medical condition and the patient appears stable for discharge with appropriate outpatient follow up.  Patient has been MEDICALLY CLEARED for return to [facility] rehab center.  Diagnosis was discussed with patient who verbalizes understanding and is agreeable to discharge.  Final Clinical Impression(s) / ED Diagnoses Final diagnoses:  Lower abdominal pain  Trichomonas infection  Vaginal discharge    Rx / DC Orders ED Discharge Orders    None       Legrand Rams 07/19/19 1347    Gerhard Munch, MD 07/19/19 [phone]

## 2019-07-19 NOTE — Discharge Instructions (Addendum)
You are MEDICALLY CLEARED to return to [facility] rehab facility.  The abdominal pain you have been experiencing intermittently over the past few years is likely chronic, you should continue to follow-up with your primary care doctor for evaluation of this pain your evaluation today does not show signs of a ethical emergency requiring further evaluation.  Your lab work today looks good.  You were tested and treated today for gonorrhea and trichomonas.  Your chlamydia test is pending, if it returns positive in the next 2 to 3 days please fill the prescription for doxycycline you were given today and take this twice daily for the next 7 days. You were also tested for HIV and syphilis. Test results typically take 2-3 days to come back, and you will be called by phone with any positive results.  Negative results can also be viewed online through your MyChart account.  Please make sure you are using protection when sexually active.  You can go to the health department for any future STD testing needs.

## 2019-07-19 NOTE — ED Notes (Signed)
Patient verbalizes understanding of discharge instructions. Opportunity for questioning and answers were provided. Armband removed by staff, pt discharged from ED ambulatory to home.   Paperwork and Medical Clearance faxed to facility per pts request.

## 2019-07-20 LAB — RPR: RPR Ser Ql: NONREACTIVE

## 2019-07-20 LAB — GC/CHLAMYDIA PROBE AMP (~~LOC~~) NOT AT ARMC
Chlamydia: NEGATIVE
Neisseria Gonorrhea: NEGATIVE

## 2020-01-07 ENCOUNTER — Emergency Department (HOSPITAL_COMMUNITY)
Admission: EM | Admit: 2020-01-07 | Discharge: 2020-01-07 | Disposition: A | Discharge status: Home / Self Care | Payer: Medicaid Other | Attending: Emergency Medicine | Admitting: Emergency Medicine

## 2020-01-07 ENCOUNTER — Other Ambulatory Visit: Payer: Self-pay

## 2020-01-07 ENCOUNTER — Encounter (HOSPITAL_COMMUNITY): Payer: Self-pay

## 2020-01-07 DIAGNOSIS — Z5321 Procedure and treatment not carried out due to patient leaving prior to being seen by health care provider: Secondary | ICD-10-CM | POA: Insufficient documentation

## 2020-01-07 DIAGNOSIS — G8929 Other chronic pain: Secondary | ICD-10-CM | POA: Diagnosis not present

## 2020-01-07 DIAGNOSIS — M545 Low back pain: Secondary | ICD-10-CM | POA: Insufficient documentation

## 2020-01-07 NOTE — ED Triage Notes (Signed)
Pt presents with c/o lower back pain radiating down her right leg. Pt reports that she has chronic back pain but that this pain is more acute and has been getting worse. Pt denies any recent injury.

## 2020-01-08 ENCOUNTER — Emergency Department (HOSPITAL_BASED_OUTPATIENT_CLINIC_OR_DEPARTMENT_OTHER)
Admission: EM | Admit: 2020-01-08 | Discharge: 2020-01-08 | Disposition: A | Discharge status: Home / Self Care | Payer: Medicaid Other | Attending: Emergency Medicine | Admitting: Emergency Medicine

## 2020-01-08 ENCOUNTER — Other Ambulatory Visit: Payer: Self-pay

## 2020-01-08 ENCOUNTER — Encounter (HOSPITAL_BASED_OUTPATIENT_CLINIC_OR_DEPARTMENT_OTHER): Payer: Self-pay | Admitting: Emergency Medicine

## 2020-01-08 DIAGNOSIS — J209 Acute bronchitis, unspecified: Secondary | ICD-10-CM | POA: Insufficient documentation

## 2020-01-08 DIAGNOSIS — Z9851 Tubal ligation status: Secondary | ICD-10-CM | POA: Insufficient documentation

## 2020-01-08 DIAGNOSIS — F17210 Nicotine dependence, cigarettes, uncomplicated: Secondary | ICD-10-CM | POA: Insufficient documentation

## 2020-01-08 DIAGNOSIS — M5442 Lumbago with sciatica, left side: Secondary | ICD-10-CM | POA: Diagnosis not present

## 2020-01-08 HISTORY — DX: Dorsalgia, unspecified: M54.9

## 2020-01-08 MED ORDER — PREDNISONE 10 MG (21) PO TBPK: ORAL_TABLET | Freq: Every day | ORAL | 0 refills | Status: DC

## 2020-01-08 MED ORDER — METHOCARBAMOL 500 MG PO TABS: 500.0000 mg | ORAL_TABLET | Freq: Three times a day (TID) | ORAL | 0 refills | Status: DC | PRN

## 2020-01-08 MED ORDER — LIDOCAINE 5 % EX PTCH: 1.0000 | MEDICATED_PATCH | Freq: Every day | CUTANEOUS | 0 refills | Status: DC | PRN

## 2020-01-08 MED FILL — METHOCARBAMOL 500 MG TABS: 500 | 5 days supply | Qty: 15 | Fill #0

## 2020-01-08 MED FILL — predniSONE 10 MG TABS: 10 | 6 days supply | Qty: 21 | Fill #0

## 2020-01-08 NOTE — ED Triage Notes (Signed)
Low back pain for many years.  A few days ago it started getting worse, radiating down left leg and up into neck.   For 3 days the pain is much worse if she flexes her neck.

## 2020-01-08 NOTE — ED Provider Notes (Signed)
[facility] EMERGENCY DEPARTMENT Provider Note   CSN: [id number] Arrival date & time: 01/08/20  0920     History Chief Complaint  Patient presents with  . Back Pain    Becky Pham is a 39 y.o. female with a history of asthma who is S/p tubal ligation & presents to the ED with complaints of back pain for the past few days. 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 States it is primarily located in the left lower back pain with radiation into the LLE, also has some L sided neck/mid back pain with certain movements. No alleviating factors. She has tried ibuprofen, tylenol, and other OTC analgesics without much relief. No recent traumatic injury. Does a lot of cleaning at home which may have aggravated it. Denies numbness, tingling, weakness, saddle anesthesia, incontinence to bowel/bladder, fever, chills, IV drug use, dysuria, or hx of cancer. Patient has not had prior back surgeries.   HPI     Past Medical History:  Diagnosis Date  . Asthma   . Bronchitis     There are no problems to display for this patient.   Past Surgical History:  Procedure Laterality Date  . TUBAL LIGATION       OB History   No obstetric history on file.     Family History  Family history unknown: Yes    Social History   Tobacco Use  . Smoking status: Current Every Day Smoker    Packs/day: 0.25    Types: Cigarettes  . Smokeless tobacco: Never Used  Substance Use Topics  . Alcohol use: Yes    Comment: occ  . Drug use: No    Frequency: 1.0 times per week    Types: Marijuana    Home Medications Prior to Admission medications   Medication Sig Start Date End Date Taking? Authorizing Provider  cetirizine (ZYRTEC) 10 MG tablet Take 1 tablet (10 mg total) by mouth daily. 12/09/17   Caccavale, Sophia, PA-C    metroNIDAZOLE (FLAGYL) 500 MG tablet Take 1 tablet (500 mg total) by mouth 2 (two) times daily. 12/14/17   Isa Rankin, MD    Allergies    Patient has no known allergies.  Review of Systems   Review of Systems  Constitutional: Negative for chills and fever.  Respiratory: Negative for cough and shortness of breath.   Cardiovascular: Negative for chest pain.  Gastrointestinal: Negative for abdominal pain, nausea and vomiting.  Genitourinary: Negative for dysuria, vaginal bleeding and vaginal discharge.  Musculoskeletal: Positive for back pain and myalgias.  Neurological: Negative for weakness and numbness.       Negative for incontinence or saddle anesthesia.     Physical Exam Updated Vital Signs BP 120/78 (BP Location: Right Arm)   Pulse 94   Temp 98.2 F (36.8 C) (Oral)   Resp 16   Ht 5\' 4"  (1.626 m)   Wt 77.8 kg   SpO2 99%   BMI 29.44 kg/m   Physical Exam Constitutional:      General: She is not in acute distress.    Appearance: She is well-developed. She is not toxic-appearing.  HENT:     Head: Normocephalic and atraumatic.  Cardiovascular:     Pulses:          Radial pulses are 2+ on the right side and 2+ on  the left side.       Posterior tibial pulses are 2+ on the right side and 2+ on the left side.  Abdominal:     Palpations: Abdomen is soft.     Tenderness: There is no abdominal tenderness. There is no guarding or rebound.  Musculoskeletal:     Cervical back: Normal range of motion and neck supple. No spinous process tenderness or muscular tenderness.     Comments: No obvious deformity, appreciable swelling, erythema, ecchymosis, significant open wounds, edema, or increased warmth.  Extremities: No focal bony tenderness.  Back: No point/focal vertebral tenderness, no palpable step off or crepitus. Patient tender to palpation to the left lumbar paraspinal muscles extending to the gluteal region on the left side.   Skin:    General: Skin is warm and  dry.     Findings: No rash.  Neurological:     Mental Status: She is alert.     Deep Tendon Reflexes:     Reflex Scores:      Patellar reflexes are 2+ on the right side and 2+ on the left side.    Comments: Sensation grossly intact to bilateral lower extremities. 5/5 symmetric strength with plantar/dorsiflexion bilaterally. Gait is intact without obvious foot drop.     ED Results / Procedures / Treatments   Labs (all labs ordered are listed, but only abnormal results are displayed) Labs Reviewed - No data to display  EKG None  Radiology No results found.  Procedures Procedures (including critical care time)  Medications Ordered in ED Medications - No data to display  ED Course  I have reviewed the triage vital signs and the nursing notes.  Pertinent labs & imaging results that were available during my care of the patient were reviewed by me and considered in my medical decision making (see chart for details).    MDM Rules/Calculators/A&P                         Patient presents with complaint of back pain.  Patient is nontoxic appearing, vitals are WNL. Patient has normal neurologic exam, no point/focal midline tenderness to palpation. Ambulatory in the ED.  No back pain red flags. No urinary sxs. No recent trauma. Most likely muscle strain versus spasm w/ degree of sciatica, considering piriformis syndrome. Additional ddx- UTI/pyelonephritis, kidney stone, aortic aneurysm/dissection, cauda equina or epidural abscess however these do not feel these diagnoses fit clinical picture at this time. Will treat with steroids, lidoderm, & Robaxin, discussed with patient that they are not to drive or operate heavy machinery while taking Robaxin. I discussed treatment plan, need for PCP follow-up, and return precautions with the patient. Provided opportunity for questions, patient confirmed understanding and is in agreement with plan.   Final Clinical Impression(s) / ED Diagnoses Final  diagnoses:  Acute left-sided low back pain with left-sided sciatica    Rx / DC Orders ED Discharge Orders         Ordered    methocarbamol (ROBAXIN) 500 MG tablet  Every 8 hours PRN     Discontinue  Reprint     01/08/20 1004    predniSONE (STERAPRED UNI-PAK 21 TAB) 10 MG (21) TBPK tablet  Daily     Discontinue  Reprint     01/08/20 1004    lidocaine (LIDODERM) 5 %  Daily PRN     Discontinue  Reprint     01/08/20 1004  Desmond Lope 01/08/20 1007    Little, Ambrose Finland, MD 01/08/20 1235

## 2020-01-08 NOTE — Discharge Instructions (Signed)
You were seen in the emergency department for back pain today.  At this time we suspect that your pain is related to a muscle strain/spasm.   I have prescribed you an anti-inflammatory medication and a muscle relaxer.  - Prednisone- this is a steroid which helps with pain & inflammation. Be sure to take this medication as prescribed with food in the mornings as prescribed  It should be taken with food, as it can cause stomach upset, and more seriously, stomach bleeding.   - Robaxin is the muscle relaxer I have prescribed, this is meant to help with muscle tightness. Be aware that this medication may make you drowsy therefore the first time you take this it should be at a time you are in an environment where you can rest. Do not drive or operate heavy machinery when taking this medication. Do not drink alcohol or take other sedating medications with this medicine such as narcotics or benzodiazepines.   - Lidoderm patch- apply 1 patch to your lower back once per day as needed for pain. This is to help soothe the muscle.   You make take Tylenol & Ibuprofen per over the counter dosing with these medications as needed- always be sure to take ibuprofen with food as for the same reasons discussed above.   We have prescribed you new medication(s) today. Discuss the medications prescribed today with your pharmacist as they can have adverse effects and interactions with your other medicines including over the counter and prescribed medications. Seek medical evaluation if you start to experience new or abnormal symptoms after taking one of these medicines, seek care immediately if you start to experience difficulty breathing, feeling of your throat closing, facial swelling, or rash as these could be indications of a more serious allergic reaction  Please see attached exercises to try to help with this.   The application of heat can help soothe the pain.   Follow up with primary care within 1 week. We have also  given information for a spine specialist. However return to the ER should you develop ne or worsening symptoms or any other concerns including but not limited to severe or worsening pain, low back pain with fever, numbness, weakness, loss of bowel or bladder control, or inability to walk or urinate, you should return to the ER immediately.

## 2020-01-10 ENCOUNTER — Ambulatory Visit: Payer: Self-pay | Admitting: *Deleted

## 2020-01-10 ENCOUNTER — Other Ambulatory Visit: Payer: Self-pay

## 2020-01-10 ENCOUNTER — Telehealth: Payer: Self-pay | Admitting: *Deleted

## 2020-01-10 ENCOUNTER — Emergency Department (HOSPITAL_BASED_OUTPATIENT_CLINIC_OR_DEPARTMENT_OTHER)
Admission: EM | Admit: 2020-01-10 | Discharge: 2020-01-10 | Disposition: A | Discharge status: Home / Self Care | Payer: Medicaid Other | Attending: Emergency Medicine | Admitting: Emergency Medicine

## 2020-01-10 ENCOUNTER — Encounter (HOSPITAL_BASED_OUTPATIENT_CLINIC_OR_DEPARTMENT_OTHER): Payer: Self-pay

## 2020-01-10 DIAGNOSIS — J45909 Unspecified asthma, uncomplicated: Secondary | ICD-10-CM | POA: Insufficient documentation

## 2020-01-10 DIAGNOSIS — Z7952 Long term (current) use of systemic steroids: Secondary | ICD-10-CM | POA: Insufficient documentation

## 2020-01-10 DIAGNOSIS — Z87891 Personal history of nicotine dependence: Secondary | ICD-10-CM | POA: Insufficient documentation

## 2020-01-10 DIAGNOSIS — M5432 Sciatica, left side: Secondary | ICD-10-CM | POA: Diagnosis not present

## 2020-01-10 MED ORDER — IBUPROFEN 800 MG PO TABS: 800.0000 mg | ORAL_TABLET | Freq: Three times a day (TID) | ORAL | 0 refills | Status: AC

## 2020-01-10 MED ORDER — DIAZEPAM 2 MG PO TABS
2.0000 mg | ORAL_TABLET | Freq: Once | ORAL | Status: AC
  Administered 2020-01-10: 2 mg via ORAL
  Filled 2020-01-10: qty 1

## 2020-01-10 MED FILL — IBUPROFEN 800 MG TAB: 800 | 10 days supply | Qty: 30 | Fill #0

## 2020-01-10 NOTE — Discharge Instructions (Signed)
Take 800 mg of Motrin 3 times a day for the next 5 days and then as needed, continue your current medications otherwise.

## 2020-01-10 NOTE — Telephone Encounter (Signed)
Pt seen in ED 01/07/20  and 01/08/20, back pain.  Calling to request referral to Pain Clinic. States back pain worsening and left sided numbness. Advised to call PCP for referral. States she has appt with him 02/07/20 but cannot wait that long. Advised to call PCP for earlier appt, request referral. Advised ED for worsening symptoms.  Pt verbalizes understanding.

## 2020-01-10 NOTE — ED Provider Notes (Signed)
[facility] EMERGENCY DEPARTMENT Provider Note   CSN: [id number] Arrival date & time: 01/10/20  1045     History Chief Complaint  Patient presents with  . Leg Pain    Becky Pham is a 39 y.o. female.  The history is provided by the patient.  Leg Pain Location:  Buttock Buttock location:  L buttock Pain details:    Quality:  Aching and cramping   Radiates to:  L leg   Severity:  Moderate   Onset quality:  Gradual   Timing:  Intermittent   Progression:  Waxing and waning Chronicity:  New Relieved by:  NSAIDs and muscle relaxant (steroids) Worsened by:  Bearing weight and flexion Associated symptoms: tingling   Associated symptoms: no back pain, no decreased ROM, no fatigue, no fever, no itching, no muscle weakness, no neck pain and no swelling        Past Medical History:  Diagnosis Date  . Asthma   . Back pain   . Bronchitis     There are no problems to display for this patient.   Past Surgical History:  Procedure Laterality Date  . TUBAL LIGATION       OB History   No obstetric history on file.     Family History  Family history unknown: Yes    Social History   Tobacco Use  . Smoking status: Former Smoker    Packs/day: 0.25    Types: Cigarettes  . Smokeless tobacco: Never Used  Substance Use Topics  . Alcohol use: Yes    Comment: occ  . Drug use: No    Frequency: 1.0 times per week    Types: Marijuana    Home Medications Prior to Admission medications   Medication Sig Start Date End Date Taking? Authorizing Provider  ibuprofen (ADVIL) 800 MG tablet Take 1 tablet (800 mg total) by mouth 3 (three) times daily for 30 doses. 01/10/20 01/20/20  Becky Knoblock, DO  lidocaine (LIDODERM) 5 % Place 1 patch onto the skin daily as needed. Apply patch to area most significant pain once per day.  Remove and discard patch within 12 hours of application. 01/08/20   Petrucelli, Becky R, PA-C  methocarbamol (ROBAXIN) 500 MG tablet Take 1  tablet (500 mg total) by mouth every 8 (eight) hours as needed for muscle spasms. 01/08/20   Petrucelli, Becky R, PA-C  predniSONE (STERAPRED UNI-PAK 21 TAB) 10 MG (21) TBPK tablet Take by mouth daily. 6, 5, 4, 3, 2, 1 take as written 01/08/20   Petrucelli, Becky R, PA-C  RESTASIS 0.05 % ophthalmic emulsion 1 drop 2 (two) times daily. 12/11/19   [provider]    Allergies    Patient has no known allergies.  Review of Systems   Review of Systems  Constitutional: Negative for chills, fatigue and fever.  HENT: Negative for ear pain and sore throat.   Eyes: Negative for pain and visual disturbance.  Respiratory: Negative for cough and shortness of breath.   Cardiovascular: Negative for chest pain and palpitations.  Gastrointestinal: Negative for abdominal pain and vomiting.  Genitourinary: Negative for decreased urine volume, dysuria and hematuria.  Musculoskeletal: Positive for gait problem. Negative for arthralgias, back pain and neck pain.  Skin: Negative for color change, itching and rash.  Neurological: Positive for numbness. Negative for dizziness, seizures, syncope and weakness.  All other systems reviewed and are negative.   Physical Exam Updated Vital Signs  ED Triage Vitals [01/10/20 1100]  Enc Vitals Group  BP 121/80     Pulse Rate 77     Resp 18     Temp 97.9 F (36.6 C)     Temp Source Oral     SpO2 99 %     Weight 175 lb (79.4 kg)     Height 5\' 4"  (1.626 m)     Head Circumference      Peak Flow      Pain Score 10     Pain Loc      Pain Edu?      Excl. in GC?     Physical Exam Vitals and nursing note reviewed.  Constitutional:      General: She is not in acute distress.    Appearance: She is well-developed. She is not ill-appearing.  HENT:     Head: Normocephalic and atraumatic.  Eyes:     Conjunctiva/sclera: Conjunctivae normal.  Cardiovascular:     Rate and Rhythm: Normal rate and regular rhythm.     Pulses: Normal pulses.     Heart  sounds: No murmur heard.   Pulmonary:     Effort: Pulmonary effort is normal. No respiratory distress.     Breath sounds: Normal breath sounds.  Abdominal:     Palpations: Abdomen is soft.     Tenderness: There is no abdominal tenderness.  Musculoskeletal:        General: Tenderness present.     Cervical back: Normal range of motion and neck supple.     Comments: Tenderness to left piriformis area, no midline spinal tenderness  Skin:    General: Skin is warm and dry.  Neurological:     General: No focal deficit present.     Mental Status: She is alert.     Sensory: No sensory deficit.     Motor: No weakness.     Comments: 5+ out of 5 strength in bilateral lower extremities, normal sensation     ED Results / Procedures / Treatments   Labs (all labs ordered are listed, but only abnormal results are displayed) Labs Reviewed - No data to display  EKG None  Radiology No results found.  Procedures Procedures (including critical care time)  Medications Ordered in ED Medications  diazepam (VALIUM) tablet 2 mg (has no administration in time range)    ED Course  I have reviewed the triage vital signs and the nursing notes.  Pertinent labs & imaging results that were available during my care of the patient were reviewed by me and considered in my medical decision making (see chart for details).    MDM Rules/Calculators/A&P                          Becky Pham is a 39 year old female who presents to the ED with left leg pain.  Recently started on medicine for sciatica.  Has been on steroids, muscle relaxant for the last 2 days.  Had immediate relief after the first day on these medications.  States that she was pretty active yesterday and then woke up with spasm again.  Denies any urinary retention, loss of bowel or bladder.  No back pain.  She is tender in the piriformis area and likely does have sciatica.  Patient was given a Valium while in the ED.  Will prescribe 800 mg  tablets of Motrin.  Will refer her to sports medicine if needed.  Given return precautions and discharged in ED in good condition.  No concern  for cauda equina.  This chart was dictated using voice recognition software.  Despite best efforts to proofread,  errors can occur which can change the documentation meaning.    Final Clinical Impression(s) / ED Diagnoses Final diagnoses:  Sciatica of left side    Rx / DC Orders ED Discharge Orders         Ordered    ibuprofen (ADVIL) 800 MG tablet  3 times daily     Discontinue  Reprint     01/10/20 1114           Garald Rhew, DO 01/10/20 1120

## 2020-01-10 NOTE — Telephone Encounter (Signed)
In error

## 2020-01-10 NOTE — ED Notes (Signed)
ED Provider at bedside. 

## 2020-01-10 NOTE — ED Triage Notes (Signed)
Pt arrives ambulatory with crutch to ED, declined wheelchair back to a room from this RN. Pt states she was recently seen here for pain to left leg but reports increasing numbness and states that the medication is not helping.

## 2020-01-17 ENCOUNTER — Encounter: Payer: Self-pay | Admitting: Family Medicine

## 2020-01-17 ENCOUNTER — Other Ambulatory Visit: Payer: Self-pay

## 2020-01-17 ENCOUNTER — Ambulatory Visit (INDEPENDENT_AMBULATORY_CARE_PROVIDER_SITE_OTHER): Payer: Medicaid Other | Admitting: Family Medicine

## 2020-01-17 VITALS — BP 123/77 | HR 93 | Ht 64.0 in

## 2020-01-17 DIAGNOSIS — M5416 Radiculopathy, lumbar region: Secondary | ICD-10-CM

## 2020-01-17 MED ORDER — GABAPENTIN 300 MG PO CAPS: 300.0000 mg | ORAL_CAPSULE | Freq: Three times a day (TID) | ORAL | 1 refills | Status: DC

## 2020-01-17 NOTE — Assessment & Plan Note (Signed)
Symptoms seem consistent with nerve irritation.  Seems more lumbar related based on exam today. -Counseled on home exercise therapy and supportive care. -Gabapentin. -Could consider physical therapy or SI joint injection

## 2020-01-17 NOTE — Progress Notes (Signed)
Becky Pham - 39 y.o. female MRN [id number]  Date of birth: 1980/10/31  SUBJECTIVE:  Including CC & ROS.  Chief Complaint  Patient presents with  . Back Pain    left-sided low back x 2 weeks    Becky Pham is a 39 y.o. female that is presenting with left leg pain.  She has been experiencing this for the past few weeks.  Is occurring over the lateral aspect of the leg itself.  No improvement with medications thus far.  No specific inciting event.  It is worse with any activity and movement..  Independent review of the lumbar spine x-ray from 2017 shows no acute changes.   Review of Systems See HPI   HISTORY: Past Medical, Surgical, Social, and Family History Reviewed & Updated per EMR.   Pertinent Historical Findings include:  Past Medical History:  Diagnosis Date  . Asthma   . Back pain   . Bronchitis     Past Surgical History:  Procedure Laterality Date  . TUBAL LIGATION      Family History  Family history unknown: Yes    Social History   Socioeconomic History  . Marital status: Single    Spouse name: Not on file  . Number of children: Not on file  . Years of education: Not on file  . Highest education level: Not on file  Occupational History  . Not on file  Tobacco Use  . Smoking status: Former Smoker    Packs/day: 0.25    Types: Cigarettes  . Smokeless tobacco: Never Used  Substance and Sexual Activity  . Alcohol use: Yes    Comment: occ  . Drug use: No    Frequency: 1.0 times per week    Types: Marijuana  . Sexual activity: Not on file  Other Topics Concern  . Not on file  Social History Narrative  . Not on file   Social Determinants of Health   Financial Resource Strain:   . Difficulty of Paying Living Expenses:   Food Insecurity:   . Worried About Programme researcher, broadcasting/film/video in the Last Year:   . Barista in the Last Year:   Transportation Needs:   . Freight forwarder (Medical):   Marland Kitchen Lack of Transportation (Non-Medical):     Physical Activity:   . Days of Exercise per Week:   . Minutes of Exercise per Session:   Stress:   . Feeling of Stress :   Social Connections:   . Frequency of Communication with Friends and Family:   . Frequency of Social Gatherings with Friends and Family:   . Attends Religious Services:   . Active Member of Clubs or Organizations:   . Attends Banker Meetings:   Marland Kitchen Marital Status:   Intimate Partner Violence:   . Fear of Current or Ex-Partner:   . Emotionally Abused:   Marland Kitchen Physically Abused:   . Sexually Abused:      PHYSICAL EXAM:  VS: BP 123/77   Pulse 93   Ht 5\' 4"  (1.626 m)   BMI 30.04 kg/m  Physical Exam Gen: NAD, alert, cooperative with exam, well-appearing MSK:  Back/left leg: No signs of atrophy. Normal strength with hip flexion. Deep tendon reflexes at the patella and Achilles are symmetric. Positive straight leg raise. Neurovascular intact  ASSESSMENT & PLAN:   Lumbar radiculopathy Symptoms seem consistent with nerve irritation.  Seems more lumbar related based on exam today. -Counseled on home exercise therapy and  supportive care. -Gabapentin. -Could consider physical therapy or SI joint injection

## 2020-01-17 NOTE — Patient Instructions (Addendum)
Nice to meet you Please try the gabapentin. This can make you sleepy so try it at night in the beginning. You can increase this to 2 or 3 times daily as you tolerate.  You could try a TENS unit. You can buy these on [employer] Please try the gabapentin  Please try the exercises   Please send me a message in MyChart with any questions or updates.  Please see me back in 4 weeks.   --Dr. Jordan Likes

## 2020-02-08 ENCOUNTER — Ambulatory Visit: Payer: Medicaid Other | Attending: Internal Medicine

## 2020-02-08 ENCOUNTER — Other Ambulatory Visit: Payer: Self-pay

## 2020-02-08 DIAGNOSIS — R2689 Other abnormalities of gait and mobility: Secondary | ICD-10-CM | POA: Insufficient documentation

## 2020-02-08 DIAGNOSIS — M5386 Other specified dorsopathies, lumbar region: Secondary | ICD-10-CM | POA: Insufficient documentation

## 2020-02-08 DIAGNOSIS — M5442 Lumbago with sciatica, left side: Secondary | ICD-10-CM | POA: Insufficient documentation
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
## 2020-02-09 NOTE — Patient Instructions (Signed)
   Pillow at chest level as a step prior to prone on elbows

## 2020-02-15 ENCOUNTER — Ambulatory Visit: Payer: Medicaid Other | Admitting: Physical Therapy

## 2020-02-19 ENCOUNTER — Other Ambulatory Visit: Payer: Self-pay

## 2020-02-19 ENCOUNTER — Emergency Department (HOSPITAL_COMMUNITY)
Admission: EM | Admit: 2020-02-19 | Discharge: 2020-02-19 | Disposition: A | Discharge status: Home / Self Care | Payer: Medicaid Other | Attending: Emergency Medicine | Admitting: Emergency Medicine

## 2020-02-19 ENCOUNTER — Encounter (HOSPITAL_COMMUNITY): Payer: Self-pay

## 2020-02-19 DIAGNOSIS — M5442 Lumbago with sciatica, left side: Secondary | ICD-10-CM | POA: Insufficient documentation

## 2020-02-19 DIAGNOSIS — Z5321 Procedure and treatment not carried out due to patient leaving prior to being seen by health care provider: Secondary | ICD-10-CM | POA: Insufficient documentation

## 2020-02-19 NOTE — ED Notes (Signed)
Screener reports pt left a while ago.

## 2020-02-19 NOTE — ED Triage Notes (Signed)
Patient c/o left lower back pain that radiates into the left leg. Patient states she has known sciatica and known L7 issues. Patient states the pain is worse today. Patient states she has PT, ortho appointment and Sports medicine appointment tomorrow,but needs something for pain today.
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
## 2020-02-19 NOTE — ED Notes (Signed)
I called patient for recheck vitals and no one responded

## 2020-02-19 NOTE — ED Notes (Signed)
No answer from lobby  

## 2020-02-20 ENCOUNTER — Telehealth (INDEPENDENT_AMBULATORY_CARE_PROVIDER_SITE_OTHER): Payer: Medicaid Other | Admitting: Family Medicine

## 2020-02-20 DIAGNOSIS — M5416 Radiculopathy, lumbar region: Secondary | ICD-10-CM | POA: Diagnosis not present

## 2020-02-20 NOTE — Progress Notes (Signed)
Virtual Visit via Telephone Note  I connected with Becky Pham on 02/20/20 at 11:30 AM EDT by telephone and verified that I am speaking with the correct person using two identifiers.   I discussed the limitations, risks, security and privacy concerns of performing an evaluation and management service by telephone and the availability of in person appointments. I also discussed with the patient that there may be a patient responsible charge related to this service. The patient expressed understanding and agreed to proceed.  Patient: home  Physician: office   History of Present Illness:  Ms. Becky Pham is a 39 year old female who has having ongoing sciatic type pain.  She had an MRI was completed on 8/19.  Pain is not improved with therapy today.  She is also being seen at [facility].  She is scheduled to be followed up with them.   Observations/Objective:   Assessment and Plan:  Lumbar radiculopathy: MRI from 8/19 is showing a disc herniation.  She has follow-up at another office office that ordered the MRI.  Encouraged for her to be seen sooner if her symptoms are ongoing.   Follow Up Instructions:    I discussed the assessment and treatment plan with the patient. The patient was provided an opportunity to ask questions and all were answered. The patient agreed with the plan and demonstrated an understanding of the instructions.   The patient was advised to call back or seek an in-person evaluation if the symptoms worsen or if the condition fails to improve as anticipated.  I provided 5 minutes of non-face-to-face time during this encounter.   Clare Gandy, MD

## 2020-02-20 NOTE — Assessment & Plan Note (Signed)
MRI from 8/19 is showing a disc herniation.  She has follow-up at another office office that ordered the MRI.  Encouraged for her to be seen sooner if her symptoms are ongoing.

## 2020-02-22 ENCOUNTER — Ambulatory Visit: Payer: Medicaid Other | Attending: Internal Medicine

## 2020-02-23 ENCOUNTER — Telehealth: Payer: Self-pay

## 2020-02-23 NOTE — Telephone Encounter (Signed)
Left message re: no show visit. Advised re: attendance policy and upcoming appt on 02/26/20.

## 2020-02-26 ENCOUNTER — Ambulatory Visit: Payer: Medicaid Other | Admitting: Physical Therapy

## 2020-02-26 ENCOUNTER — Telehealth: Payer: Self-pay | Admitting: Physical Therapy

## 2020-02-26 NOTE — Telephone Encounter (Signed)
Left voicemail in regards to pt's missed 5:00pm appointment today.  Informed pt of attendance policy and that if she fails to make her next appointment she would be discharged from therapy. Otherwise, informed pt to cancel if she is not able to make her next appointment on 9/15 at 6:30pm.   Rondle Lohse April Dell Ponto, PT, DPT

## 2020-02-28 ENCOUNTER — Ambulatory Visit: Payer: Medicaid Other

## 2020-03-07 ENCOUNTER — Ambulatory Visit: Payer: Medicaid Other

## 2020-03-14 ENCOUNTER — Ambulatory Visit: Payer: Medicaid Other

## 2020-03-14 ENCOUNTER — Telehealth: Payer: Self-pay

## 2020-03-14 NOTE — Telephone Encounter (Signed)
Spoke with pt who said she called the office after her "first one", implying injection and told front desk she had another one on the 28th. She implied she thought we would cancel this appt, which we did not. Pt has another injection on 10/13. She agreed to get a new Rx from MD when she is ready to continue PT in the future.   Bettey Mare. Corliss Marcus, PT, DPT

## 2020-03-19 ENCOUNTER — Ambulatory Visit: Payer: Medicaid Other

## 2020-03-23 ENCOUNTER — Encounter (HOSPITAL_COMMUNITY): Payer: Self-pay | Admitting: Emergency Medicine

## 2020-03-23 ENCOUNTER — Emergency Department (HOSPITAL_COMMUNITY)
Admission: EM | Admit: 2020-03-23 | Discharge: 2020-03-24 | Disposition: A | Discharge status: Home / Self Care | Payer: Medicaid Other | Attending: Emergency Medicine | Admitting: Emergency Medicine

## 2020-03-23 ENCOUNTER — Other Ambulatory Visit: Payer: Self-pay

## 2020-03-23 DIAGNOSIS — Z79899 Other long term (current) drug therapy: Secondary | ICD-10-CM | POA: Diagnosis not present

## 2020-03-23 DIAGNOSIS — G8929 Other chronic pain: Secondary | ICD-10-CM | POA: Insufficient documentation

## 2020-03-23 DIAGNOSIS — F17210 Nicotine dependence, cigarettes, uncomplicated: Secondary | ICD-10-CM | POA: Insufficient documentation

## 2020-03-23 DIAGNOSIS — Z5321 Procedure and treatment not carried out due to patient leaving prior to being seen by health care provider: Secondary | ICD-10-CM | POA: Diagnosis not present

## 2020-03-23 DIAGNOSIS — M545 Low back pain, unspecified: Secondary | ICD-10-CM | POA: Insufficient documentation

## 2020-03-23 NOTE — ED Triage Notes (Signed)
Pt reports having increasing back pain after recently having injections in back for pain. Pt states today the pain began in lower back and radiates down left leg.

## 2020-03-24 ENCOUNTER — Other Ambulatory Visit: Payer: Self-pay

## 2020-03-24 ENCOUNTER — Encounter (HOSPITAL_BASED_OUTPATIENT_CLINIC_OR_DEPARTMENT_OTHER): Payer: Self-pay | Admitting: *Deleted

## 2020-03-24 ENCOUNTER — Encounter (HOSPITAL_COMMUNITY): Payer: Self-pay | Admitting: Emergency Medicine

## 2020-03-24 ENCOUNTER — Emergency Department (HOSPITAL_BASED_OUTPATIENT_CLINIC_OR_DEPARTMENT_OTHER)
Admission: EM | Admit: 2020-03-24 | Discharge: 2020-03-24 | Disposition: A | Discharge status: Home / Self Care | Payer: Medicaid Other | Source: Home / Self Care | Attending: Emergency Medicine | Admitting: Emergency Medicine

## 2020-03-24 ENCOUNTER — Emergency Department (HOSPITAL_COMMUNITY)
Admission: EM | Admit: 2020-03-24 | Discharge: 2020-03-24 | Disposition: A | Discharge status: Home / Self Care | Payer: Medicaid Other | Source: Home / Self Care

## 2020-03-24 DIAGNOSIS — M545 Low back pain, unspecified: Secondary | ICD-10-CM | POA: Insufficient documentation

## 2020-03-24 DIAGNOSIS — F17210 Nicotine dependence, cigarettes, uncomplicated: Secondary | ICD-10-CM | POA: Insufficient documentation

## 2020-03-24 DIAGNOSIS — G8929 Other chronic pain: Secondary | ICD-10-CM | POA: Insufficient documentation

## 2020-03-24 DIAGNOSIS — Z5321 Procedure and treatment not carried out due to patient leaving prior to being seen by health care provider: Secondary | ICD-10-CM | POA: Insufficient documentation

## 2020-03-24 DIAGNOSIS — J45909 Unspecified asthma, uncomplicated: Secondary | ICD-10-CM | POA: Insufficient documentation

## 2020-03-24 DIAGNOSIS — M5442 Lumbago with sciatica, left side: Secondary | ICD-10-CM | POA: Insufficient documentation

## 2020-03-24 MED ORDER — OXYCODONE-ACETAMINOPHEN 5-325 MG PO TABS
1.0000 | ORAL_TABLET | Freq: Once | ORAL | Status: AC
  Administered 2020-03-24: 1 via ORAL
  Filled 2020-03-24: qty 1

## 2020-03-24 MED ORDER — OXYCODONE-ACETAMINOPHEN 5-325 MG PO TABS: 1.0000 | ORAL_TABLET | ORAL | 0 refills | Status: AC | PRN

## 2020-03-24 NOTE — ED Notes (Signed)
Reviewed D/C with pt, reviewed Rx with pt, pt denies questions at this time. 

## 2020-03-24 NOTE — ED Triage Notes (Signed)
Pt has Hx of low back pain with bulging discs. She has had 2 spinal injections without relief. She has also been on steroids. She is taking methocarbamol, gabapentin, and other prescribed medications without relief

## 2020-03-24 NOTE — Discharge Instructions (Signed)
Please follow-up with your orthopedic doctor as previously scheduled.  You can take the Percocet as needed for breakthrough pain.  Note this can make you drowsy and should not be taken while driving or operating heavy machinery.  If you develop worsening numbness, weakness, bladder or bowel incontinence, fever or other new concerning symptom, return to ER for reassessment.

## 2020-03-24 NOTE — ED Triage Notes (Addendum)
Pt to triage via [facility] from home.  Reports lower back pain x 3 months that radiates down L leg.  States she received her 2nd cortisone shot at orthopedist on Tuesday and is scheduled to go back on the 13th.  States she also took PO steroid without relief.  Went to [employer] last night and LWBS.

## 2020-03-25 NOTE — ED Provider Notes (Signed)
[facility] EMERGENCY DEPARTMENT Provider Note   CSN: [id number] Arrival date & time: 03/24/20  1449     History Chief Complaint  Patient presents with  . Back Pain    Becky Pham is a 39 y.o. female.  Presenting to the emergency room with concern for back pain.  She reports that she has many years of back pain, is followed by orthopedic surgery for back pain.  She recently received 2 steroid injections without significant relief.  She has been taking Robaxin, gabapentin without relief.  She reports that she has a follow-up appointment this coming week with her orthopedist but she feels that she cannot manage the pain at home right now.  Pain is 10 out of 10 sharp, stabbing, worse on left side of back, radiates down left leg.  Reports that she chronically has left foot lower leg numb/tingling sensation.  No difficulty walking, no new weakness, no bladder or bowel incontinence.  No recent falls, no recent fevers.  Has not had surgery previously on spine.   HPI     Past Medical History:  Diagnosis Date  . Asthma   . Back pain   . Bronchitis     Patient Active Problem List   Diagnosis Date Noted  . Lumbar radiculopathy 01/17/2020    Past Surgical History:  Procedure Laterality Date  . TUBAL LIGATION       OB History   No obstetric history on file.     Family History  Family history unknown: Yes    Social History   Tobacco Use  . Smoking status: Current Some Day Smoker    Packs/day: 0.25    Types: Cigarettes  . Smokeless tobacco: Never Used  Vaping Use  . Vaping Use: Never used  Substance Use Topics  . Alcohol use: Yes    Comment: occ  . Drug use: Not Currently    Types: Marijuana    Home Medications Prior to Admission medications   Medication Sig Start Date End Date Taking? Authorizing Provider  gabapentin (NEURONTIN) 300 MG capsule Take 1 capsule (300 mg total) by mouth 3 (three) times daily. 01/17/20   Myra Rude, MD  lidocaine  (LIDODERM) 5 % Place 1 patch onto the skin daily as needed. Apply patch to area most significant pain once per day.  Remove and discard patch within 12 hours of application. 01/08/20   Petrucelli, Samantha R, PA-C  methocarbamol (ROBAXIN) 500 MG tablet Take 1 tablet (500 mg total) by mouth every 8 (eight) hours as needed for muscle spasms. 01/08/20   Petrucelli, Pleas Koch, PA-C  oxyCODONE-acetaminophen (PERCOCET) 5-325 MG tablet Take 1 tablet by mouth every 4 (four) hours as needed for up to 3 days for severe pain. 03/24/20 03/27/20  Milagros Loll, MD  predniSONE (STERAPRED UNI-PAK 21 TAB) 10 MG (21) TBPK tablet Take by mouth daily. 6, 5, 4, 3, 2, 1 take as written 01/08/20   Petrucelli, Samantha R, PA-C  RESTASIS 0.05 % ophthalmic emulsion 1 drop 2 (two) times daily. 12/11/19   [provider]    Allergies    Patient has no known allergies.  Review of Systems   Review of Systems  Constitutional: Negative for chills and fever.  HENT: Negative for ear pain and sore throat.   Eyes: Negative for pain and visual disturbance.  Respiratory: Negative for cough and shortness of breath.   Cardiovascular: Negative for chest pain and palpitations.  Gastrointestinal: Negative for abdominal pain and vomiting.  Genitourinary: Negative for dysuria and hematuria.  Musculoskeletal: Positive for back pain. Negative for arthralgias.  Skin: Negative for color change and rash.  Neurological: Positive for numbness. Negative for seizures and syncope.  All other systems reviewed and are negative.   Physical Exam Updated Vital Signs BP (!) 138/98   Pulse 80   Temp 98.2 F (36.8 C) (Oral)   Resp 16   Ht 5\' 4"  (1.626 m)   Wt 80.7 kg   LMP 03/15/2020   SpO2 100%   BMI 30.55 kg/m   Physical Exam Vitals and nursing note reviewed.  Constitutional:      General: She is not in acute distress.    Appearance: She is well-developed.  HENT:     Head: Normocephalic and atraumatic.  Eyes:      Conjunctiva/sclera: Conjunctivae normal.  Cardiovascular:     Rate and Rhythm: Normal rate.     Pulses: Normal pulses.  Pulmonary:     Effort: Pulmonary effort is normal. No respiratory distress.  Abdominal:     Palpations: Abdomen is soft.     Tenderness: There is no abdominal tenderness.  Musculoskeletal:        General: No deformity or signs of injury.     Cervical back: Neck supple.  Skin:    General: Skin is warm and dry.  Neurological:     General: No focal deficit present.     Mental Status: She is alert.     Comments: 5/5 strength in b/l LE Sensation to light touch intact in b/l LE     ED Results / Procedures / Treatments   Labs (all labs ordered are listed, but only abnormal results are displayed) Labs Reviewed - No data to display  EKG None  Radiology No results found.  Procedures Procedures (including critical care time)  Medications Ordered in ED Medications  oxyCODONE-acetaminophen (PERCOCET/ROXICET) 5-325 MG per tablet 1 tablet (1 tablet Oral Given 03/24/20 1848)    ED Course  I have reviewed the triage vital signs and the nursing notes.  Pertinent labs & imaging results that were available during my care of the patient were reviewed by me and considered in my medical decision making (see chart for details).    MDM Rules/Calculators/A&P                         39 year old lady presenting to ER with concern for acute on chronic low back pain.  Reports she is followed closely by orthopedics.  On physical exam, she is well-appearing with a normal neurologic exam.  No new neurologic complaints that day.  Provided dose of Percocet in ER, offered very short Rx for Percocet in the outpatient setting to help alleviate her pain.  Given her reassuring exam, no change in complaints from her baseline back do not feel she warrants ER imaging today.  Precautions with her and discharged.  She is ambulatory in no distress.   After the discussed management above, the  patient was determined to be safe for discharge.  The patient was in agreement with this plan and all questions regarding their care were answered.  ED return precautions were discussed and the patient will return to the ED with any significant worsening of condition.    Final Clinical Impression(s) / ED Diagnoses Final diagnoses:  Chronic midline low back pain with left-sided sciatica    Rx / DC Orders ED Discharge Orders         Ordered  oxyCODONE-acetaminophen (PERCOCET) 5-325 MG tablet  Every 4 hours PRN        03/24/20 1904           Milagros Loll, MD 03/25/20 1008

## 2020-04-08 ENCOUNTER — Other Ambulatory Visit: Payer: Self-pay | Admitting: Orthopedic Surgery

## 2020-05-10 NOTE — Progress Notes (Signed)
Your procedure is scheduled on Wednesday, December 1st.  Report to [facility] Main Entrance "A" at 10:00 A.M., and check in at the Admitting office.  Call this number if you have problems the morning of surgery:  [phone]  Call [phone] if you have any questions prior to your surgery date Monday-Friday 8am-4pm   Remember:  Do not eat after midnight the night before your surgery  You may drink clear liquids until 10:00 A.M. the morning of your surgery.   Clear liquids allowed are: Water, Non-Citrus Juices (without pulp), Carbonated Beverages, Clear Tea, Black Coffee Only, and Gatorade   Enhanced Recovery after Surgery for Orthopedics Enhanced Recovery after Surgery is a protocol used to improve the stress on your body and your recovery after surgery.  Patient Instructions  . The night before surgery:  o No food after midnight. ONLY clear liquids after midnight  .  Marland Kitchen The day of surgery (if you do NOT have diabetes):  o Drink ONE (1) Pre-Surgery Clear Ensure by 10:00 A.M. the morning of surgery  o This drink was given to you during your hospital  pre-op appointment visit. o Nothing else to drink after completing the  Pre-Surgery Clear Ensure.         If you have questions, please contact your surgeon's office.   Take these medicines the morning of surgery with A SIP OF WATER   If needed: acetaminophen (TYLENOL), ondansetron (ZOFRAN), oxyCODONE-acetaminophen (PERCOCET/ROXICET)     As of today, STOP taking any Aspirin (unless otherwise instructed by your surgeon) Aleve, Naproxen, Ibuprofen, Motrin, Advil, Goody's, BC's, all herbal medications, fish oil, and all vitamins.                     Do not wear jewelry, make up, or nail polish            Do not wear lotions, powders, perfumes, or deodorant.            Men may shave face and neck.            Do not bring valuables to the hospital.            [facility] is not responsible for any belongings or valuables.  Do NOT  Smoke (Tobacco/Vaping) or drink Alcohol 24 hours prior to your procedure If you use a CPAP at night, you may bring all equipment for your overnight stay.   Contacts, glasses, dentures or bridgework may not be worn into surgery.      For patients admitted to the hospital, discharge time will be determined by your treatment team.   Patients discharged the day of surgery will not be allowed to drive home, and someone needs to stay with them for 24 hours.  Special instructions:   [place]- Preparing For Surgery  Before surgery, you can play an important role. Because skin is not sterile, your skin needs to be as free of germs as possible. You can reduce the number of germs on your skin by washing with CHG (chlorahexidine gluconate) Soap before surgery.  CHG is an antiseptic cleaner which kills germs and bonds with the skin to continue killing germs even after washing.    Oral Hygiene is also important to reduce your risk of infection.  Remember - BRUSH YOUR TEETH THE MORNING OF SURGERY WITH YOUR REGULAR TOOTHPASTE  Please do not use if you have an allergy to CHG or antibacterial soaps. If your skin becomes reddened/irritated stop using the CHG.  Do not shave (including legs and underarms) for at least 48 hours prior to first CHG shower. It is OK to shave your face.  Please follow these instructions carefully.   1. Shower the NIGHT BEFORE SURGERY and the MORNING OF SURGERY with CHG Soap.   2. If you chose to wash your hair, wash your hair first as usual with your normal shampoo.  3. After you shampoo, rinse your hair and body thoroughly to remove the shampoo.  4. Use CHG as you would any other liquid soap. You can apply CHG directly to the skin and wash gently with a scrungie or a clean washcloth.   5. Apply the CHG Soap to your body ONLY FROM THE NECK DOWN.  Do not use on open wounds or open sores. Avoid contact with your eyes, ears, mouth and genitals (private parts). Wash Face and  genitals (private parts)  with your normal soap.   6. Wash thoroughly, paying special attention to the area where your surgery will be performed.  7. Thoroughly rinse your body with warm water from the neck down.  8. DO NOT shower/wash with your normal soap after using and rinsing off the CHG Soap.  9. Pat yourself dry with a CLEAN TOWEL.  10. Wear CLEAN PAJAMAS to bed the night before surgery  11. Place CLEAN SHEETS on your bed the night of your first shower and DO NOT SLEEP WITH PETS.  Day of Surgery: Wear Clean/Comfortable clothing the morning of surgery Do not apply any deodorants/lotions.   Remember to brush your teeth WITH YOUR REGULAR TOOTHPASTE.   Please read over the following fact sheets that you were given.

## 2020-05-13 ENCOUNTER — Encounter (HOSPITAL_COMMUNITY)
Admission: RE | Admit: 2020-05-13 | Discharge: 2020-05-13 | Disposition: A | Discharge status: Home / Self Care | Payer: Medicaid Other | Source: Ambulatory Visit | Attending: Orthopedic Surgery | Admitting: Orthopedic Surgery

## 2020-05-13 ENCOUNTER — Encounter (HOSPITAL_COMMUNITY): Payer: Self-pay

## 2020-05-13 ENCOUNTER — Other Ambulatory Visit: Payer: Self-pay

## 2020-05-13 ENCOUNTER — Other Ambulatory Visit (HOSPITAL_COMMUNITY)
Admission: RE | Admit: 2020-05-13 | Discharge: 2020-05-13 | Disposition: A | Discharge status: Home / Self Care | Payer: Medicaid Other | Source: Ambulatory Visit | Attending: Gastroenterology | Admitting: Gastroenterology

## 2020-05-13 DIAGNOSIS — Z01812 Encounter for preprocedural laboratory examination: Secondary | ICD-10-CM | POA: Insufficient documentation

## 2020-05-13 DIAGNOSIS — Z20822 Contact with and (suspected) exposure to covid-19: Secondary | ICD-10-CM | POA: Insufficient documentation

## 2020-05-13 HISTORY — DX: Anemia, unspecified: D64.9

## 2020-05-13 HISTORY — DX: Depression, unspecified: F32.A

## 2020-05-13 HISTORY — DX: Anxiety disorder, unspecified: F41.9

## 2020-05-13 LAB — COMPREHENSIVE METABOLIC PANEL
ALT: 14 U/L (ref 0–44)
AST: 17 U/L (ref 15–41)
Albumin: 4.7 g/dL (ref 3.5–5.0)
Alkaline Phosphatase: 47 U/L (ref 38–126)
Anion gap: 11 (ref 5–15)
BUN: 8 mg/dL (ref 6–20)
CO2: 23 mmol/L (ref 22–32)
Calcium: 9.5 mg/dL (ref 8.9–10.3)
Chloride: 105 mmol/L (ref 98–111)
Creatinine, Ser: 0.84 mg/dL (ref 0.44–1.00)
GFR, Estimated: >60 mL/min (ref >60)
Glucose, Bld: 105 mg/dL — ABNORMAL HIGH (ref 70–99)
Potassium: 4.1 mmol/L (ref 3.5–5.1)
Sodium: 139 mmol/L (ref 135–145)
Total Bilirubin: 0.3 mg/dL (ref 0.3–1.2)
Total Protein: 7.1 g/dL (ref 6.5–8.1)

## 2020-05-13 LAB — URINALYSIS, ROUTINE W REFLEX MICROSCOPIC
Bilirubin Urine: NEGATIVE
Glucose, UA: NEGATIVE mg/dL
Hgb urine dipstick: NEGATIVE
Ketones, ur: NEGATIVE mg/dL
Leukocytes,Ua: NEGATIVE
Nitrite: NEGATIVE
Protein, ur: NEGATIVE mg/dL
Specific Gravity, Urine: 1.018 (ref 1.005–1.030)
pH: 5 (ref 5.0–8.0)

## 2020-05-13 LAB — SURGICAL PCR SCREEN
MRSA, PCR: NEGATIVE
Staphylococcus aureus: POSITIVE — AB

## 2020-05-13 LAB — CBC WITH DIFFERENTIAL/PLATELET
Abs Immature Granulocytes: 0.05 10*3/uL (ref 0.00–0.07)
Basophils Absolute: 0.1 10*3/uL (ref 0.0–0.1)
Basophils Relative: 1 %
Eosinophils Absolute: 0 10*3/uL (ref 0.0–0.5)
Eosinophils Relative: 0 %
HCT: 38.9 % (ref 36.0–46.0)
Hemoglobin: 12.7 g/dL (ref 12.0–15.0)
Immature Granulocytes: 1 %
Lymphocytes Relative: 18 %
Lymphs Abs: 1 10*3/uL (ref 0.7–4.0)
MCH: 28.3 pg (ref 26.0–34.0)
MCHC: 32.6 g/dL (ref 30.0–36.0)
MCV: 86.6 fL (ref 80.0–100.0)
Monocytes Absolute: 0.5 10*3/uL (ref 0.1–1.0)
Monocytes Relative: 9 %
Neutro Abs: 3.9 10*3/uL (ref 1.7–7.7)
Neutrophils Relative %: 71 %
Platelets: 256 10*3/uL (ref 150–400)
RBC: 4.49 MIL/uL (ref 3.87–5.11)
RDW: 14.6 % (ref 11.5–15.5)
WBC: 5.5 10*3/uL (ref 4.0–10.5)
nRBC: 0 % (ref 0.0–0.2)

## 2020-05-13 LAB — TYPE AND SCREEN
ABO/RH(D): B POS
Antibody Screen: NEGATIVE

## 2020-05-13 LAB — SARS CORONAVIRUS 2 (TAT 6-24 HRS): SARS Coronavirus 2: NEGATIVE

## 2020-05-13 LAB — PROTIME-INR
INR: 1.1 (ref 0.8–1.2)
Prothrombin Time: 13.9 seconds (ref 11.4–15.2)

## 2020-05-13 LAB — APTT: aPTT: 28 seconds (ref 24–36)

## 2020-05-13 NOTE — Progress Notes (Signed)
PCP - Dr. Fleet Contras Cardiologist - Denies  PPM/ICD - Denies  Chest x-ray - N/A EKG - N/A Stress Test - Denies ECHO - Denies Cardiac Cath - Denies  Sleep Study - Denies  Patient denies having diabetes.  Blood Thinner Instructions: N/A Aspirin Instructions: N/A  ERAS Protcol - Yes PRE-SURGERY Ensure - Yes  COVID TEST- 05/13/20   Coronavirus Screening  Have you experienced the following symptoms:  Cough yes/no: No Fever (>100.11F)  yes/no: No Runny nose yes/no: No Sore throat yes/no: No Difficulty breathing/shortness of breath  yes/no: No  Have you or a family member traveled in the last 14 days and where? yes/no: No   If the patient indicates "YES" to the above questions, their PAT will be rescheduled to limit the exposure to others and, the surgeon will be notified. THE PATIENT WILL NEED TO BE ASYMPTOMATIC FOR 14 DAYS.   If the patient is not experiencing any of these symptoms, the PAT nurse will instruct them to NOT bring anyone with them to their appointment since they may have these symptoms or traveled as well.   Please remind your patients and families that hospital visitation restrictions are in effect and the importance of the restrictions.     Anesthesia review: No  Patient denies shortness of breath, fever, cough and chest pain at PAT appointment   All instructions explained to the patient, with a verbal understanding of the material. Patient agrees to go over the instructions while at home for a better understanding. Patient also instructed to self quarantine after being tested for COVID-19. The opportunity to ask questions was provided.

## 2020-05-15 ENCOUNTER — Ambulatory Visit (HOSPITAL_COMMUNITY)
Admission: RE | Admit: 2020-05-15 | Discharge: 2020-05-15 | Disposition: A | Discharge status: Home / Self Care | Payer: Medicaid Other | Attending: Orthopedic Surgery | Admitting: Orthopedic Surgery

## 2020-05-15 ENCOUNTER — Ambulatory Visit: Payer: Self-pay

## 2020-05-15 ENCOUNTER — Other Ambulatory Visit: Payer: Self-pay

## 2020-05-15 ENCOUNTER — Encounter (HOSPITAL_COMMUNITY): Payer: Self-pay | Admitting: Orthopedic Surgery

## 2020-05-15 ENCOUNTER — Ambulatory Visit (HOSPITAL_COMMUNITY): Payer: Medicaid Other | Admitting: Anesthesiology

## 2020-05-15 ENCOUNTER — Ambulatory Visit (HOSPITAL_COMMUNITY)
Admission: RE | Disposition: A | Discharge status: Home / Self Care | Payer: Self-pay | Source: Home / Self Care | Attending: Orthopedic Surgery

## 2020-05-15 ENCOUNTER — Ambulatory Visit (HOSPITAL_COMMUNITY): Payer: Medicaid Other

## 2020-05-15 DIAGNOSIS — M5410 Radiculopathy, site unspecified: Secondary | ICD-10-CM

## 2020-05-15 DIAGNOSIS — M5117 Intervertebral disc disorders with radiculopathy, lumbosacral region: Secondary | ICD-10-CM | POA: Diagnosis not present

## 2020-05-15 DIAGNOSIS — F17210 Nicotine dependence, cigarettes, uncomplicated: Secondary | ICD-10-CM | POA: Diagnosis not present

## 2020-05-15 HISTORY — PX: LUMBAR LAMINECTOMY/DECOMPRESSION MICRODISCECTOMY: SHX5026

## 2020-05-15 LAB — POCT PREGNANCY, URINE: Preg Test, Ur: NEGATIVE

## 2020-05-15 SURGERY — LUMBAR LAMINECTOMY/DECOMPRESSION MICRODISCECTOMY
Anesthesia: General | Laterality: Left

## 2020-05-15 MED ORDER — OXYCODONE HCL 5 MG PO TABS: 5.0000 mg | ORAL_TABLET | Freq: Once | ORAL | Status: DC | PRN

## 2020-05-15 MED ORDER — ALBUMIN HUMAN 5 % IV SOLN: INTRAVENOUS | Status: DC | PRN

## 2020-05-15 MED ORDER — ORAL CARE MOUTH RINSE: 15.0000 mL | Freq: Once | OROMUCOSAL | Status: AC

## 2020-05-15 MED ORDER — DEXAMETHASONE SODIUM PHOSPHATE 10 MG/ML IJ SOLN
INTRAMUSCULAR | Status: DC | PRN
  Administered 2020-05-15: 10 mg via INTRAVENOUS

## 2020-05-15 MED ORDER — CHLORHEXIDINE GLUCONATE 0.12 % MT SOLN
OROMUCOSAL | Status: AC
  Administered 2020-05-15: 15 mL via OROMUCOSAL
  Filled 2020-05-15: qty 15

## 2020-05-15 MED ORDER — BUPIVACAINE LIPOSOME 1.3 % IJ SUSP
INTRAMUSCULAR | Status: DC | PRN
  Administered 2020-05-15: 20 mL

## 2020-05-15 MED ORDER — METHYLENE BLUE 0.5 % INJ SOLN
INTRAVENOUS | Status: DC | PRN
  Administered 2020-05-15: 1 mL via SUBMUCOSAL

## 2020-05-15 MED ORDER — PROPOFOL 10 MG/ML IV BOLUS
INTRAVENOUS | Status: AC
  Filled 2020-05-15: qty 20

## 2020-05-15 MED ORDER — CEFAZOLIN SODIUM-DEXTROSE 2-4 GM/100ML-% IV SOLN
2.0000 g | INTRAVENOUS | Status: AC
  Administered 2020-05-15: 2 g via INTRAVENOUS
  Filled 2020-05-15: qty 100

## 2020-05-15 MED ORDER — METHYLPREDNISOLONE ACETATE 40 MG/ML IJ SUSP
INTRAMUSCULAR | Status: AC
  Filled 2020-05-15: qty 1

## 2020-05-15 MED ORDER — HYDROMORPHONE HCL 1 MG/ML IJ SOLN
0.2500 mg | INTRAMUSCULAR | Status: DC | PRN
  Administered 2020-05-15: 0.2 mg via INTRAVENOUS
  Administered 2020-05-15: 0.3 mg via INTRAVENOUS

## 2020-05-15 MED ORDER — AMISULPRIDE (ANTIEMETIC) 5 MG/2ML IV SOLN: 10.0000 mg | Freq: Once | INTRAVENOUS | Status: DC | PRN

## 2020-05-15 MED ORDER — THROMBIN 20000 UNITS EX SOLR
CUTANEOUS | Status: DC | PRN
  Administered 2020-05-15: 20 mL via TOPICAL

## 2020-05-15 MED ORDER — POVIDONE-IODINE 7.5 % EX SOLN: Freq: Once | CUTANEOUS | Status: DC

## 2020-05-15 MED ORDER — THROMBIN (RECOMBINANT) 20000 UNITS EX SOLR
CUTANEOUS | Status: AC
  Filled 2020-05-15: qty 20000

## 2020-05-15 MED ORDER — BUPIVACAINE-EPINEPHRINE (PF) 0.25% -1:200000 IJ SOLN
INTRAMUSCULAR | Status: AC
  Filled 2020-05-15: qty 30

## 2020-05-15 MED ORDER — PHENYLEPHRINE 40 MCG/ML (10ML) SYRINGE FOR IV PUSH (FOR BLOOD PRESSURE SUPPORT)
PREFILLED_SYRINGE | INTRAVENOUS | Status: AC
  Filled 2020-05-15: qty 20

## 2020-05-15 MED ORDER — LACTATED RINGERS IV SOLN: INTRAVENOUS | Status: DC

## 2020-05-15 MED ORDER — PHENYLEPHRINE 40 MCG/ML (10ML) SYRINGE FOR IV PUSH (FOR BLOOD PRESSURE SUPPORT)
PREFILLED_SYRINGE | INTRAVENOUS | Status: DC | PRN
  Administered 2020-05-15 (×5): 80 ug via INTRAVENOUS
  Administered 2020-05-15: 40 ug via INTRAVENOUS
  Administered 2020-05-15 (×4): 80 ug via INTRAVENOUS
  Administered 2020-05-15: 40 ug via INTRAVENOUS

## 2020-05-15 MED ORDER — BUPIVACAINE-EPINEPHRINE 0.25% -1:200000 IJ SOLN
INTRAMUSCULAR | Status: DC | PRN
  Administered 2020-05-15: 25 mL
  Administered 2020-05-15: 5 mL

## 2020-05-15 MED ORDER — METHOCARBAMOL 500 MG PO TABS: 500.0000 mg | ORAL_TABLET | Freq: Four times a day (QID) | ORAL | 0 refills | Status: DC | PRN

## 2020-05-15 MED ORDER — 0.9 % SODIUM CHLORIDE (POUR BTL) OPTIME
TOPICAL | Status: DC | PRN
  Administered 2020-05-15: 1000 mL

## 2020-05-15 MED ORDER — HYDROMORPHONE HCL 1 MG/ML IJ SOLN
INTRAMUSCULAR | Status: AC
  Administered 2020-05-15: 0.5 mg via INTRAVENOUS
  Filled 2020-05-15: qty 1

## 2020-05-15 MED ORDER — ONDANSETRON HCL 4 MG/2ML IJ SOLN
INTRAMUSCULAR | Status: AC
  Filled 2020-05-15: qty 2

## 2020-05-15 MED ORDER — PROMETHAZINE HCL 25 MG/ML IJ SOLN
INTRAMUSCULAR | Status: AC
  Filled 2020-05-15: qty 1

## 2020-05-15 MED ORDER — METHYLPREDNISOLONE ACETATE 40 MG/ML IJ SUSP
INTRAMUSCULAR | Status: DC | PRN
  Administered 2020-05-15: 40 mg

## 2020-05-15 MED ORDER — MIDAZOLAM HCL 2 MG/2ML IJ SOLN
INTRAMUSCULAR | Status: AC
  Filled 2020-05-15: qty 2

## 2020-05-15 MED ORDER — PHENYLEPHRINE HCL-NACL 10-0.9 MG/250ML-% IV SOLN
INTRAVENOUS | Status: DC | PRN
  Administered 2020-05-15: 15 ug/min via INTRAVENOUS

## 2020-05-15 MED ORDER — LIDOCAINE HCL (PF) 2 % IJ SOLN
INTRAMUSCULAR | Status: AC
  Filled 2020-05-15: qty 5

## 2020-05-15 MED ORDER — ACETAMINOPHEN 500 MG PO TABS
1000.0000 mg | ORAL_TABLET | Freq: Once | ORAL | Status: AC
  Administered 2020-05-15: 1000 mg via ORAL
  Filled 2020-05-15: qty 2

## 2020-05-15 MED ORDER — PROMETHAZINE HCL 25 MG/ML IJ SOLN
6.2500 mg | INTRAMUSCULAR | Status: DC | PRN
  Administered 2020-05-15: 6.25 mg via INTRAVENOUS

## 2020-05-15 MED ORDER — PROPOFOL 10 MG/ML IV BOLUS
INTRAVENOUS | Status: DC | PRN
  Administered 2020-05-15: 200 mg via INTRAVENOUS

## 2020-05-15 MED ORDER — DEXAMETHASONE SODIUM PHOSPHATE 10 MG/ML IJ SOLN
INTRAMUSCULAR | Status: AC
  Filled 2020-05-15: qty 1

## 2020-05-15 MED ORDER — BUPIVACAINE LIPOSOME 1.3 % IJ SUSP
20.0000 mL | INTRAMUSCULAR | Status: DC
  Filled 2020-05-15: qty 20

## 2020-05-15 MED ORDER — ROCURONIUM BROMIDE 10 MG/ML (PF) SYRINGE
PREFILLED_SYRINGE | INTRAVENOUS | Status: AC
  Filled 2020-05-15: qty 10

## 2020-05-15 MED ORDER — LIDOCAINE 2% (20 MG/ML) 5 ML SYRINGE
INTRAMUSCULAR | Status: DC | PRN
  Administered 2020-05-15: 80 mg via INTRAVENOUS

## 2020-05-15 MED ORDER — SCOPOLAMINE 1 MG/3DAYS TD PT72
1.0000 | MEDICATED_PATCH | TRANSDERMAL | Status: DC
  Administered 2020-05-15: 1.5 mg via TRANSDERMAL
  Filled 2020-05-15: qty 1

## 2020-05-15 MED ORDER — FENTANYL CITRATE (PF) 250 MCG/5ML IJ SOLN
INTRAMUSCULAR | Status: DC | PRN
  Administered 2020-05-15: 25 ug via INTRAVENOUS
  Administered 2020-05-15: 50 ug via INTRAVENOUS
  Administered 2020-05-15: 25 ug via INTRAVENOUS
  Administered 2020-05-15: 50 ug via INTRAVENOUS
  Administered 2020-05-15: 100 ug via INTRAVENOUS

## 2020-05-15 MED ORDER — CHLORHEXIDINE GLUCONATE 0.12 % MT SOLN: 15.0000 mL | Freq: Once | OROMUCOSAL | Status: AC

## 2020-05-15 MED ORDER — OXYCODONE HCL 5 MG/5ML PO SOLN: 5.0000 mg | Freq: Once | ORAL | Status: DC | PRN

## 2020-05-15 MED ORDER — ROCURONIUM BROMIDE 10 MG/ML (PF) SYRINGE
PREFILLED_SYRINGE | INTRAVENOUS | Status: DC | PRN
  Administered 2020-05-15: 10 mg via INTRAVENOUS
  Administered 2020-05-15: 70 mg via INTRAVENOUS
  Administered 2020-05-15: 10 mg via INTRAVENOUS

## 2020-05-15 MED ORDER — OXYCODONE-ACETAMINOPHEN 5-325 MG PO TABS
1.0000 | ORAL_TABLET | ORAL | 0 refills | Status: AC | PRN
Start: 2020-05-15 — End: 2020-05-22

## 2020-05-15 MED ORDER — FENTANYL CITRATE (PF) 250 MCG/5ML IJ SOLN
INTRAMUSCULAR | Status: AC
  Filled 2020-05-15: qty 5

## 2020-05-15 MED ORDER — ONDANSETRON HCL 4 MG/2ML IJ SOLN
INTRAMUSCULAR | Status: DC | PRN
  Administered 2020-05-15: 4 mg via INTRAVENOUS

## 2020-05-15 MED ORDER — MIDAZOLAM HCL 2 MG/2ML IJ SOLN
INTRAMUSCULAR | Status: DC | PRN
  Administered 2020-05-15: 2 mg via INTRAVENOUS

## 2020-05-15 MED ORDER — METHYLENE BLUE 0.5 % INJ SOLN
INTRAVENOUS | Status: AC
  Filled 2020-05-15: qty 10

## 2020-05-15 MED ORDER — SUGAMMADEX SODIUM 200 MG/2ML IV SOLN
INTRAVENOUS | Status: DC | PRN
  Administered 2020-05-15: 200 mg via INTRAVENOUS

## 2020-05-15 SURGICAL SUPPLY — 75 items
AGENT HMST KT MTR STRL THRMB (HEMOSTASIS) ×1
APL SKNCLS STERI-STRIP NONHPOA (GAUZE/BANDAGES/DRESSINGS) ×2
BENZOIN TINCTURE PRP APPL 2/3 (GAUZE/BANDAGES/DRESSINGS) ×4 IMPLANT
BNDG GAUZE ELAST 4 BULKY (GAUZE/BANDAGES/DRESSINGS) ×3 IMPLANT
BUR ROUND PRECISION 4.0 (BURR) ×2 IMPLANT
BUR ROUND PRECISION 4.0MM (BURR) ×1
CABLE BIPOLOR RESECTION CORD (MISCELLANEOUS) ×3 IMPLANT
CANISTER SUCT 3000ML PPV (MISCELLANEOUS) ×3 IMPLANT
CARTRIDGE OIL MAESTRO DRILL (MISCELLANEOUS) ×1 IMPLANT
CLOSURE STERI-STRIP 1/2X4 (GAUZE/BANDAGES/DRESSINGS) ×1
CLOSURE WOUND 1/2 X4 (GAUZE/BANDAGES/DRESSINGS) ×1
CLSR STERI-STRIP ANTIMIC 1/2X4 (GAUZE/BANDAGES/DRESSINGS) ×1 IMPLANT
COVER SURGICAL LIGHT HANDLE (MISCELLANEOUS) ×3 IMPLANT
COVER WAND RF STERILE (DRAPES) ×3 IMPLANT
DIFFUSER DRILL AIR PNEUMATIC (MISCELLANEOUS) ×3 IMPLANT
DRAPE POUCH INSTRU U-SHP 10X18 (DRAPES) ×6 IMPLANT
DRAPE SURG 17X23 STRL (DRAPES) ×12 IMPLANT
DURAPREP 26ML APPLICATOR (WOUND CARE) ×3 IMPLANT
ELECT BLADE 4.0 EZ CLEAN MEGAD (MISCELLANEOUS) ×3
ELECT CAUTERY BLADE 6.4 (BLADE) ×3 IMPLANT
ELECT REM PT RETURN 9FT ADLT (ELECTROSURGICAL) ×3
ELECTRODE BLDE 4.0 EZ CLN MEGD (MISCELLANEOUS) ×1 IMPLANT
ELECTRODE REM PT RTRN 9FT ADLT (ELECTROSURGICAL) ×1 IMPLANT
FILTER STRAW FLUID ASPIR (MISCELLANEOUS) ×3 IMPLANT
GAUZE 4X4 16PLY RFD (DISPOSABLE) ×6 IMPLANT
GAUZE SPONGE 4X4 12PLY STRL (GAUZE/BANDAGES/DRESSINGS) ×3 IMPLANT
GLOVE BIO SURGEON STRL SZ7 (GLOVE) ×3 IMPLANT
GLOVE BIO SURGEON STRL SZ8 (GLOVE) ×3 IMPLANT
GLOVE BIOGEL PI IND STRL 7.0 (GLOVE) ×1 IMPLANT
GLOVE BIOGEL PI IND STRL 8 (GLOVE) ×1 IMPLANT
GLOVE BIOGEL PI INDICATOR 7.0 (GLOVE) ×2
GLOVE BIOGEL PI INDICATOR 8 (GLOVE) ×2
GOWN STRL REUS W/ TWL LRG LVL3 (GOWN DISPOSABLE) ×1 IMPLANT
GOWN STRL REUS W/ TWL XL LVL3 (GOWN DISPOSABLE) ×2 IMPLANT
GOWN STRL REUS W/TWL LRG LVL3 (GOWN DISPOSABLE) ×3
GOWN STRL REUS W/TWL XL LVL3 (GOWN DISPOSABLE) ×6
IV CATH 14GX2 1/4 (CATHETERS) ×3 IMPLANT
KIT BASIN OR (CUSTOM PROCEDURE TRAY) ×3 IMPLANT
KIT POSITION SURG JACKSON T1 (MISCELLANEOUS) ×3 IMPLANT
KIT TURNOVER KIT B (KITS) ×3 IMPLANT
MARKER SKIN DUAL TIP RULER LAB (MISCELLANEOUS) ×3 IMPLANT
NDL 18GX1X1/2 (RX/OR ONLY) (NEEDLE) ×1 IMPLANT
NDL HYPO 25GX1X1/2 BEV (NEEDLE) ×1 IMPLANT
NDL SPNL 18GX3.5 QUINCKE PK (NEEDLE) ×2 IMPLANT
NEEDLE 18GX1X1/2 (RX/OR ONLY) (NEEDLE) ×3 IMPLANT
NEEDLE 22X1 1/2 (OR ONLY) (NEEDLE) ×3 IMPLANT
NEEDLE HYPO 25GX1X1/2 BEV (NEEDLE) ×3 IMPLANT
NEEDLE SPNL 18GX3.5 QUINCKE PK (NEEDLE) ×6 IMPLANT
NS IRRIG 1000ML POUR BTL (IV SOLUTION) ×3 IMPLANT
OIL CARTRIDGE MAESTRO DRILL (MISCELLANEOUS) ×3
PACK LAMINECTOMY ORTHO (CUSTOM PROCEDURE TRAY) ×3 IMPLANT
PACK UNIVERSAL I (CUSTOM PROCEDURE TRAY) ×3 IMPLANT
PAD ARMBOARD 7.5X6 YLW CONV (MISCELLANEOUS) ×6 IMPLANT
PATTIES SURGICAL .5 X.5 (GAUZE/BANDAGES/DRESSINGS) ×4 IMPLANT
PATTIES SURGICAL .5 X1 (DISPOSABLE) ×3 IMPLANT
SPONGE INTESTINAL PEANUT (DISPOSABLE) ×3 IMPLANT
SPONGE SURGIFOAM ABS GEL SZ50 (HEMOSTASIS) ×3 IMPLANT
STRIP CLOSURE SKIN 1/2X4 (GAUZE/BANDAGES/DRESSINGS) ×1 IMPLANT
SURGIFLO W/THROMBIN 8M KIT (HEMOSTASIS) ×2 IMPLANT
SUT MNCRL AB 4-0 PS2 18 (SUTURE) ×3 IMPLANT
SUT VIC AB 0 CT1 18XCR BRD 8 (SUTURE) IMPLANT
SUT VIC AB 0 CT1 8-18 (SUTURE) ×3
SUT VIC AB 1 CT1 18XCR BRD 8 (SUTURE) ×1 IMPLANT
SUT VIC AB 1 CT1 8-18 (SUTURE) ×3
SUT VIC AB 2-0 CT2 18 VCP726D (SUTURE) ×3 IMPLANT
SYR 20ML LL LF (SYRINGE) ×3 IMPLANT
SYR BULB IRRIG 60ML STRL (SYRINGE) ×3 IMPLANT
SYR CONTROL 10ML LL (SYRINGE) ×6 IMPLANT
SYR TB 1ML 25GX5/8 (SYRINGE) ×6 IMPLANT
SYR TB 1ML LUER SLIP (SYRINGE) ×6 IMPLANT
TAPE CLOTH SURG 4X10 WHT LF (GAUZE/BANDAGES/DRESSINGS) ×2 IMPLANT
TOWEL GREEN STERILE (TOWEL DISPOSABLE) ×3 IMPLANT
TOWEL GREEN STERILE FF (TOWEL DISPOSABLE) ×3 IMPLANT
WATER STERILE IRR 1000ML POUR (IV SOLUTION) ×3 IMPLANT
YANKAUER SUCT BULB TIP NO VENT (SUCTIONS) ×3 IMPLANT

## 2020-05-15 NOTE — OR Nursing (Signed)
Pt LSO brace in place and pt presently sitting in wheel chair , pt in NAD waiting on family.

## 2020-05-15 NOTE — H&P (Signed)
PREOPERATIVE H&P  Chief Complaint: Left leg pain  HPI: Becky Pham is a 39 y.o. female who presents with ongoing pain in the left leg  MRI reveals a very large left L5/S1 HNP, compressing the left S1 nerve  Patient has failed multiple forms of conservative care and continues to have pain (see office notes for additional details regarding the patient's full course of treatment)  Past Medical History:  Diagnosis Date  . Anemia   . Anxiety   . Asthma   . Back pain   . Bronchitis   . Depression    Past Surgical History:  Procedure Laterality Date  . TUBAL LIGATION     Social History   Socioeconomic History  . Marital status: Single    Spouse name: Not on file  . Number of children: Not on file  . Years of education: Not on file  . Highest education level: Not on file  Occupational History  . Not on file  Tobacco Use  . Smoking status: Current Some Day Smoker    Packs/day: 0.25    Types: Cigarettes  . Smokeless tobacco: Never Used  Vaping Use  . Vaping Use: Never used  Substance and Sexual Activity  . Alcohol use: Yes    Comment: occ  . Drug use: Not Currently    Types: Marijuana  . Sexual activity: Not on file  Other Topics Concern  . Not on file  Social History Narrative  . Not on file   Social Determinants of Health   Financial Resource Strain:   . Difficulty of Paying Living Expenses: Not on file  Food Insecurity:   . Worried About Programme researcher, broadcasting/film/video in the Last Year: Not on file  . Ran Out of Food in the Last Year: Not on file  Transportation Needs:   . Lack of Transportation (Medical): Not on file  . Lack of Transportation (Non-Medical): Not on file  Physical Activity:   . Days of Exercise per Week: Not on file  . Minutes of Exercise per Session: Not on file  Stress:   . Feeling of Stress : Not on file  Social Connections:   . Frequency of Communication with Friends and Family: Not on file  . Frequency of Social Gatherings with  Friends and Family: Not on file  . Attends Religious Services: Not on file  . Active Member of Clubs or Organizations: Not on file  . Attends Banker Meetings: Not on file  . Marital Status: Not on file   Family History  Family history unknown: Yes   No Known Allergies Prior to Admission medications   Medication Sig Start Date End Date Taking? Authorizing Provider  acetaminophen (TYLENOL) 500 MG tablet Take 500 mg by mouth every 6 (six) hours as needed for moderate pain.   Yes [provider]  ondansetron (ZOFRAN) 4 MG tablet Take 4 mg by mouth 3 (three) times daily as needed for nausea/vomiting. 04/22/20  Yes [provider]  oxyCODONE-acetaminophen (PERCOCET/ROXICET) 5-325 MG tablet Take 1 tablet by mouth every 4 (four) hours as needed for pain. 04/22/20  Yes [provider]  gabapentin (NEURONTIN) 300 MG capsule Take 1 capsule (300 mg total) by mouth 3 (three) times daily. Patient not taking: Reported on 05/06/2020 01/17/20   Myra Rude, MD  lidocaine (LIDODERM) 5 % Place 1 patch onto the skin daily as needed. Apply patch to area most significant pain once per day.  Remove and discard  patch within 12 hours of application. Patient not taking: Reported on 05/06/2020 01/08/20   Petrucelli, Pleas Koch, PA-C  methocarbamol (ROBAXIN) 500 MG tablet Take 1 tablet (500 mg total) by mouth every 8 (eight) hours as needed for muscle spasms. Patient not taking: Reported on 05/06/2020 01/08/20   Petrucelli, Pleas Koch, PA-C  predniSONE (STERAPRED UNI-PAK 21 TAB) 10 MG (21) TBPK tablet Take by mouth daily. 6, 5, 4, 3, 2, 1 take as written Patient not taking: Reported on 05/06/2020 01/08/20   Petrucelli, Samantha R, PA-C  RESTASIS 0.05 % ophthalmic emulsion 1 drop 2 (two) times daily. Patient not taking: Reported on 05/06/2020 12/11/19   [provider]     All other systems have been reviewed and were otherwise negative with the exception of those  mentioned in the HPI and as above.  Physical Exam: There were no vitals filed for this visit.  There is no height or weight on file to calculate BMI.  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

## 2020-05-15 NOTE — Anesthesia Procedure Notes (Signed)
Procedure Name: Intubation Date/Time: 05/15/2020 12:36 PM Performed by: Shary Decamp, CRNA Pre-anesthesia Checklist: Patient identified, Patient being monitored, Timeout performed, Emergency Drugs available and Suction available Patient Re-evaluated:Patient Re-evaluated prior to induction Oxygen Delivery Method: Circle System Utilized Preoxygenation: Pre-oxygenation with 100% oxygen Induction Type: IV induction Ventilation: Mask ventilation without difficulty Laryngoscope Size: Miller and 2 Grade View: Grade I Tube type: Oral Tube size: 7.0 mm Number of attempts: 1 Airway Equipment and Method: Stylet Placement Confirmation: ETT inserted through vocal cords under direct vision,  positive ETCO2 and breath sounds checked- equal and bilateral Secured at: 21 cm Tube secured with: Tape Dental Injury: Teeth and Oropharynx as per pre-operative assessment

## 2020-05-15 NOTE — Anesthesia Preprocedure Evaluation (Addendum)
Anesthesia Evaluation  Patient identified by MRN, date of birth, ID band Patient awake    Reviewed: Allergy & Precautions, NPO status , Patient's Chart, lab work & pertinent test results  Airway Mallampati: II  TM Distance: >3 FB Neck ROM: Full    Dental no notable dental hx.    Pulmonary asthma , Current Smoker,    breath sounds clear to auscultation       Cardiovascular Exercise Tolerance: Good negative cardio ROS Normal cardiovascular exam Rhythm:Regular Rate:Normal     Neuro/Psych PSYCHIATRIC DISORDERS Anxiety Depression  Neuromuscular disease (radiculopathy)    GI/Hepatic negative GI ROS, Neg liver ROS,   Endo/Other  negative endocrine ROS  Renal/GU negative Renal ROS  negative genitourinary   Musculoskeletal negative musculoskeletal ROS (+)   Abdominal Normal abdominal exam  (+)   Peds negative pediatric ROS (+)  Hematology  (+) FACTOR VIII (HEMOPHILIA) ,   Anesthesia Other Findings   Reproductive/Obstetrics negative OB ROS                            Anesthesia Physical Anesthesia Plan  ASA: II  Anesthesia Plan: General   Post-op Pain Management:    Induction: Intravenous  PONV Risk Score and Plan: 2 and Midazolam, Scopolamine patch - Pre-op, Ondansetron and Dexamethasone  Airway Management Planned: Oral ETT  Additional Equipment:   Intra-op Plan:   Post-operative Plan: Extubation in OR  Informed Consent: I have reviewed the patients History and Physical, chart, labs and discussed the procedure including the risks, benefits and alternatives for the proposed anesthesia with the patient or authorized representative who has indicated his/her understanding and acceptance.       Plan Discussed with: Anesthesiologist and CRNA  Anesthesia Plan Comments:        Anesthesia Quick Evaluation

## 2020-05-15 NOTE — Transfer of Care (Signed)
Immediate Anesthesia Transfer of Care Note  Patient: Becky Pham  Procedure(s) Performed: LEFT-SIDED LUMBAR 5 - SACRUM 1 MICRODISECTOMY (Left )  Patient Location: PACU  Anesthesia Type:General  Level of Consciousness: awake, alert  and oriented  Airway & Oxygen Therapy: Patient Spontanous Breathing  Post-op Assessment: Report given to RN and Post -op Vital signs reviewed and stable  Post vital signs: Reviewed and stable  Last Vitals:  Vitals Value Taken Time  BP    Temp    Pulse    Resp    SpO2      Last Pain:  Vitals:   05/15/20 1035  TempSrc:   PainSc: 7          Complications: No complications documented.

## 2020-05-15 NOTE — Op Note (Signed)
PATIENT NAME: Becky Pham   MEDICAL RECORD NO.:   [id number]    DATE OF BIRTH: May 28, 1981   DATE OF PROCEDURE: 05/15/2020                               OPERATIVE REPORT     PREOPERATIVE DIAGNOSES: 1. Left-sided S1 radiculopathy. 2. Very large left-sided L5-S1 disk herniation causing severe     compression of the left S1 nerve.   POSTOPERATIVE DIAGNOSES: 1. Left-sided S1 radiculopathy. 2. Very large left-sided L5-S1 disk herniation causing severe     compression of the left S1 nerve.   PROCEDURES:   Complex left-sided L5-S1 laminotomy with partial facetectomy and removal of large herniated left-sided L5-S1 disk fragment.   SURGEON:  Estill Bamberg, MD.   ASSISTANTJason Coop, PA-C.   ANESTHESIA:  General endotracheal anesthesia.   COMPLICATIONS:  None.   DISPOSITION:  Stable.   ESTIMATED BLOOD LOSS:  Minimal.   INDICATIONS FOR SURGERY:  Briefly, Becky Pham is a pleasant 39 year old female, who did present to me with severe pain in the left  leg.  The patient's MRI did reveal the findings outlined above, clearly notable for a large herniated disk fragment. The pain was rather severe and Becky Pham did have weakness in her leg as well.  We did discuss treatment options and we did ultimately elect to proceed with the procedure reflected above.  The patient was fully made aware of the risks of surgery, including the risk of recurrent herniation and the need for subsequent surgery, including the possibility of a subsequent diskectomy and/or fusion.   OPERATIVE DETAILS:  On 05/15/2020, the patient was brought to surgery and general endotracheal anesthesia was administered.  The patient was placed prone on a well-padded flat Jackson bed with a spinal frame.  Antibiotics were given.  The back was prepped and draped and a time-out procedure was performed.  At this point, a midline incision was made directly over the L5-S1 intervertebral space.  A curvilinear incision was made  just to the left of the midline into the fascia.  A self-retaining McCulloch retractor was placed. The lamina of L5 and S1 was identified and subperiosteally exposed.  I then removed the lateral aspect of the L5-S1 ligamentum flavum, the inferior aspect of the L5 lamina, and the superior aspect of the S1 lamina.  Readily identified was the traversing left S1 nerve, which was noted to be under very obvious tension, and was noted to be very erythematous.   Gaining medial retraction of the nerve was very meticulous, as there was a large pedunculated disc fragment extending immediately posteriorly, making medial retraction very difficult.  I was however work both above and below the herniation, and medially retract the nerve.  A large disc herniation was noted.  This herniation did appear to be chronic.  I did use a 15 blade knife to perform an annulotomy at the posterior lateral aspect of the herniated disc fragment.  Multiple fragments were identified and removed.  Of note, there were multiple fragments medially, which were very much adherent to the annulus.  I did liberally use a reverse angled Epstein curette to displace these fragments into the intervertebral space, after which point they were removed.  This did take significantly more time and effort than a typical discectomy.  I do estimate that safely freeing up these fragments and removing them took in total, approximately 90 minutes to do safely,  whereas typically, this portion of the procedure would normally take 10 to 15 minutes.  I was pleased with the final decompression that I was able to accomplish, however, a significant annulotomy at the posterior lateral aspect of the L5-S1 intervertebral disc space did remain, given the fact that the multiple compressive herniated disc fragments were very much adherent to the annulus, and both the annulus and the intervertebral disc fragments did need to be removed in order to safely and adequately decompress the  traversing left S1 nerve. I was very pleased with the final decompression that I was able to accomplish.  At this point, the wound was copiously irrigated with normal saline.  All epidural bleeding was controlled using bipolar electrocautery in addition to Surgiflo. All bleeding was controlled at the termination of the procedure.  At this point, 20 mg of Depo-Medrol was introduced about the epidural space in the region of the left S1 nerve.  The wound was then closed in layers using #1 Vicryl followed by 0 Vicryl, followed by 4-0 Monocryl. Benzoin and Steri-Strips were applied followed by a sterile dressing. All instrument counts were correct at the termination of the procedure.  I did discuss with the patient's significant other immediately following the surgery, Dwayne, that the surgery was much more meticulous than usual, and that Aldea will need to minimize any twisting and bending and lifting for a total of 3 months after surgery, as I do feel that Becky Pham is at an increased risk for a recurrent disc herniation and/or recurrent stenosis.   Of note, Jason Coop was my assistant throughout surgery, and did aid in retraction, suctioning, and closure from start to finish.     Estill Bamberg, MD

## 2020-05-15 NOTE — OR Nursing (Signed)
Pt is awake,alert and oriented. Pt is in NAD at this time. Pt and family verbalized understanding of poc and discharge instructions. instructions given to family and reviewed prior to discharge.Pt is ambulatory to wheelchair with stand by assist but not needed with steady gait.  Pt will be taken to front lobby and placed in car with family.   

## 2020-05-16 ENCOUNTER — Encounter (HOSPITAL_COMMUNITY): Payer: Self-pay | Admitting: Orthopedic Surgery

## 2020-05-16 MED FILL — Thrombin (Recombinant) For Soln 20000 Unit: CUTANEOUS | Qty: 1 | Status: AC

## 2020-05-16 NOTE — Anesthesia Postprocedure Evaluation (Signed)
Anesthesia Post Note  Patient: Becky Pham  Procedure(s) Performed: LEFT-SIDED LUMBAR 5 - SACRUM 1 MICRODISECTOMY (Left )     Patient location during evaluation: PACU Anesthesia Type: General Level of consciousness: awake Pain management: pain level controlled Vital Signs Assessment: post-procedure vital signs reviewed and stable Respiratory status: spontaneous breathing and respiratory function stable Cardiovascular status: stable Postop Assessment: no apparent nausea or vomiting Anesthetic complications: no   No complications documented.  Last Vitals:  Vitals:   05/15/20 1630 05/15/20 1645  BP: (!) 122/95 (!) 141/93  Pulse: 81 80  Resp: 17 16  Temp:    SpO2: 100% 100%    Last Pain:  Vitals:   05/15/20 1645  TempSrc:   PainSc: 4                  Candra R Marline Morace

## 2020-08-05 ENCOUNTER — Other Ambulatory Visit: Payer: Self-pay | Admitting: Orthopedic Surgery

## 2020-08-05 DIAGNOSIS — M5416 Radiculopathy, lumbar region: Secondary | ICD-10-CM

## 2020-08-20 ENCOUNTER — Other Ambulatory Visit: Payer: Self-pay | Admitting: Internal Medicine

## 2020-08-22 LAB — C. TRACHOMATIS/N. GONORRHOEAE RNA
C. trachomatis RNA, TMA: NOT DETECTED
N. gonorrhoeae RNA, TMA: NOT DETECTED

## 2020-08-29 ENCOUNTER — Other Ambulatory Visit: Payer: Self-pay

## 2020-08-29 ENCOUNTER — Ambulatory Visit
Admission: RE | Admit: 2020-08-29 | Discharge: 2020-08-29 | Disposition: A | Discharge status: Home / Self Care | Payer: Medicaid Other | Source: Ambulatory Visit | Attending: Orthopedic Surgery | Admitting: Orthopedic Surgery

## 2020-08-29 DIAGNOSIS — M5416 Radiculopathy, lumbar region: Secondary | ICD-10-CM

## 2020-08-29 MED ORDER — GADOBENATE DIMEGLUMINE 529 MG/ML IV SOLN
15.0000 mL | Freq: Once | INTRAVENOUS | Status: AC | PRN
  Administered 2020-08-29: 15 mL via INTRAVENOUS

## 2020-09-10 ENCOUNTER — Telehealth (HOSPITAL_COMMUNITY): Payer: Medicaid Other | Admitting: Psychiatry

## 2020-09-10 ENCOUNTER — Other Ambulatory Visit: Payer: Self-pay | Admitting: Orthopedic Surgery

## 2020-09-11 ENCOUNTER — Ambulatory Visit: Payer: Medicaid Other | Admitting: Obstetrics

## 2020-10-07 NOTE — Pre-Procedure Instructions (Signed)
Surgical Instructions    Your procedure is scheduled on Thursday, April 28th.  Report to [facility] Main Entrance "A" at 5:45 A.M., then check in with the Admitting office.  Call this number if you have problems the morning of surgery:  [phone]   If you have any questions prior to your surgery date call [phone]: Open Monday-Friday 8am-4pm    Remember:  Do not eat after midnight the night before your surgery  You may drink clear liquids until 4:45 the morning of your surgery.   Clear liquids allowed are: Water, Non-Citrus Juices (without pulp), Carbonated Beverages, Clear Tea, Black Coffee Only, and Gatorade.   Enhanced Recovery after Surgery for Orthopedics Enhanced Recovery after Surgery is a protocol used to improve the stress on your body and your recovery after surgery.  Patient Instructions  . The night before surgery:  o No food after midnight. ONLY clear liquids after midnight   . The day of surgery (if you do NOT have diabetes):  o Drink ONE (1) Pre-Surgery Clear Ensure by 4: 45 am the morning of surgery   o This drink was given to you during your hospital  pre-op appointment visit. o Nothing else to drink after completing the  Pre-Surgery Clear Ensure.          If you have questions, please contact your surgeon's office.     Take these medicines the morning of surgery with A SIP OF WATER: ARIPiprazole (ABILIFY)  cetirizine (ZYRTEC) FLUoxetine (PROZAC)  olopatadine (PATANOL)   Take these medications as needed: acetaminophen (TYLENOL)  methocarbamol (ROBAXIN)-as needed for muscle spasms  As of today, STOP taking any Aspirin (unless otherwise instructed by your surgeon) Aleve, Naproxen, Ibuprofen, Motrin, Advil, Goody's, BC's, all herbal medications, fish oil, and all vitamins.                     Do NOT Smoke (Tobacco/Vaping) or drink Alcohol 24 hours prior to your procedure.  If you use a CPAP at night, you may bring all equipment for your  overnight stay.   Contacts, glasses, piercing's, hearing aid's, dentures or partials may not be worn into surgery, please bring cases for these belongings.    For patients admitted to the hospital, discharge time will be determined by your treatment team.   Patients discharged the day of surgery will not be allowed to drive home, and someone needs to stay with them for 24 hours.    Special instructions:   [place]- Preparing For Surgery  Before surgery, you can play an important role. Because skin is not sterile, your skin needs to be as free of germs as possible. You can reduce the number of germs on your skin by washing with CHG (chlorahexidine gluconate) Soap before surgery.  CHG is an antiseptic cleaner which kills germs and bonds with the skin to continue killing germs even after washing.    Oral Hygiene is also important to reduce your risk of infection.  Remember - BRUSH YOUR TEETH THE MORNING OF SURGERY WITH YOUR REGULAR TOOTHPASTE  Please do not use if you have an allergy to CHG or antibacterial soaps. If your skin becomes reddened/irritated stop using the CHG.  Do not shave (including legs and underarms) for at least 48 hours prior to first CHG shower. It is OK to shave your face.  Please follow these instructions carefully.   1. Shower the NIGHT BEFORE SURGERY and the MORNING OF SURGERY  2. If you chose to wash  your hair, wash your hair first as usual with your normal shampoo.  3. After you shampoo, rinse your hair and body thoroughly to remove the shampoo.  4. Use CHG Soap as you would any other liquid soap. You can apply CHG directly to the skin and wash gently with a scrungie or a clean washcloth.   5. Apply the CHG Soap to your body ONLY FROM THE NECK DOWN.  Do not use on open wounds or open sores. Avoid contact with your eyes, ears, mouth and genitals (private parts). Wash Face and genitals (private parts)  with your normal soap.   6. Wash thoroughly, paying special  attention to the area where your surgery will be performed.  7. Thoroughly rinse your body with warm water from the neck down.  8. DO NOT shower/wash with your normal soap after using and rinsing off the CHG Soap.  9. Pat yourself dry with a CLEAN TOWEL.  10. Wear CLEAN PAJAMAS to bed the night before surgery  11. Place CLEAN SHEETS on your bed the night before your surgery  12. DO NOT SLEEP WITH PETS.   Day of Surgery: Shower with CHG soap. Do not wear jewelry, make up, or nail polish Do not wear lotions, powders, perfumes, or deodorant. Do not shave 48 hours prior to surgery.   Do not bring valuables to the hospital. [facility] is not responsible for any belongings or valuables. Wear Clean/Comfortable clothing the morning of surgery Remember to brush your teeth WITH YOUR REGULAR TOOTHPASTE.   Please read over the following fact sheets that you were given.

## 2020-10-08 ENCOUNTER — Other Ambulatory Visit: Payer: Self-pay

## 2020-10-08 ENCOUNTER — Encounter (HOSPITAL_COMMUNITY)
Admission: RE | Admit: 2020-10-08 | Discharge: 2020-10-08 | Disposition: A | Discharge status: Home / Self Care | Payer: Medicaid Other | Source: Ambulatory Visit | Attending: Orthopedic Surgery | Admitting: Orthopedic Surgery

## 2020-10-08 ENCOUNTER — Encounter (HOSPITAL_COMMUNITY): Payer: Self-pay

## 2020-10-08 DIAGNOSIS — Z01812 Encounter for preprocedural laboratory examination: Secondary | ICD-10-CM | POA: Diagnosis not present

## 2020-10-08 DIAGNOSIS — Z20822 Contact with and (suspected) exposure to covid-19: Secondary | ICD-10-CM | POA: Diagnosis not present

## 2020-10-08 LAB — COMPREHENSIVE METABOLIC PANEL
ALT: 18 U/L (ref 0–44)
AST: 17 U/L (ref 15–41)
Albumin: 3.7 g/dL (ref 3.5–5.0)
Alkaline Phosphatase: 59 U/L (ref 38–126)
Anion gap: 5 (ref 5–15)
BUN: 10 mg/dL (ref 6–20)
CO2: 26 mmol/L (ref 22–32)
Calcium: 8.6 mg/dL — ABNORMAL LOW (ref 8.9–10.3)
Chloride: 105 mmol/L (ref 98–111)
Creatinine, Ser: 1 mg/dL (ref 0.44–1.00)
GFR, Estimated: >60 mL/min (ref >60)
Glucose, Bld: 103 mg/dL — ABNORMAL HIGH (ref 70–99)
Potassium: 4.1 mmol/L (ref 3.5–5.1)
Sodium: 136 mmol/L (ref 135–145)
Total Bilirubin: 0.3 mg/dL (ref 0.3–1.2)
Total Protein: 5.8 g/dL — ABNORMAL LOW (ref 6.5–8.1)

## 2020-10-08 LAB — URINALYSIS, ROUTINE W REFLEX MICROSCOPIC
Bilirubin Urine: NEGATIVE
Glucose, UA: NEGATIVE mg/dL
Ketones, ur: NEGATIVE mg/dL
Leukocytes,Ua: NEGATIVE
Nitrite: NEGATIVE
Protein, ur: NEGATIVE mg/dL
RBC / HPF: >50 RBC/hpf — ABNORMAL HIGH (ref 0–5)
Specific Gravity, Urine: 1.013 (ref 1.005–1.030)
pH: 6 (ref 5.0–8.0)

## 2020-10-08 LAB — CBC WITH DIFFERENTIAL/PLATELET
Abs Immature Granulocytes: 0.02 10*3/uL (ref 0.00–0.07)
Basophils Absolute: 0.1 10*3/uL (ref 0.0–0.1)
Basophils Relative: 1 %
Eosinophils Absolute: 0.1 10*3/uL (ref 0.0–0.5)
Eosinophils Relative: 1 %
HCT: 34.6 % — ABNORMAL LOW (ref 36.0–46.0)
Hemoglobin: 11.3 g/dL — ABNORMAL LOW (ref 12.0–15.0)
Immature Granulocytes: 0 %
Lymphocytes Relative: 34 %
Lymphs Abs: 2.4 10*3/uL (ref 0.7–4.0)
MCH: 27.8 pg (ref 26.0–34.0)
MCHC: 32.7 g/dL (ref 30.0–36.0)
MCV: 85 fL (ref 80.0–100.0)
Monocytes Absolute: 0.7 10*3/uL (ref 0.1–1.0)
Monocytes Relative: 10 %
Neutro Abs: 3.7 10*3/uL (ref 1.7–7.7)
Neutrophils Relative %: 54 %
Platelets: 237 10*3/uL (ref 150–400)
RBC: 4.07 MIL/uL (ref 3.87–5.11)
RDW: 16.7 % — ABNORMAL HIGH (ref 11.5–15.5)
WBC: 7 10*3/uL (ref 4.0–10.5)
nRBC: 0 % (ref 0.0–0.2)

## 2020-10-08 LAB — TYPE AND SCREEN
ABO/RH(D): B POS
Antibody Screen: NEGATIVE

## 2020-10-08 LAB — SURGICAL PCR SCREEN
MRSA, PCR: NEGATIVE
Staphylococcus aureus: NEGATIVE

## 2020-10-08 LAB — PROTIME-INR
INR: 1 (ref 0.8–1.2)
Prothrombin Time: 13.4 seconds (ref 11.4–15.2)

## 2020-10-08 LAB — APTT: aPTT: 28 seconds (ref 24–36)

## 2020-10-08 LAB — SARS CORONAVIRUS 2 (TAT 6-24 HRS): SARS Coronavirus 2: NEGATIVE

## 2020-10-08 NOTE — Progress Notes (Signed)
PCP - Evelina Bucy, MD Cardiologist - Denies  PPM/ICD - Denies  Chest x-ray - N/A EKG - N/A Stress Test - Denies ECHO - Denies Cardiac Cath - Denies  Sleep Study - Denies  Patient denies being diabetic.  Blood Thinner Instructions: N/A Aspirin Instructions: N/A  ERAS Protcol - Yes PRE-SURGERY Ensure or G2- Yes  COVID TEST- 10/08/20   Anesthesia review: No  Patient denies shortness of breath, fever, cough and chest pain at PAT appointment   All instructions explained to the patient, with a verbal understanding of the material. Patient agrees to go over the instructions while at home for a better understanding. Patient also instructed to self quarantine after being tested for COVID-19. The opportunity to ask questions was provided.

## 2020-10-09 ENCOUNTER — Ambulatory Visit: Payer: Medicaid Other | Admitting: Obstetrics

## 2020-10-10 ENCOUNTER — Inpatient Hospital Stay (HOSPITAL_COMMUNITY)
Admission: RE | Admit: 2020-10-10 | Discharge: 2020-10-11 | DRG: 455 | Disposition: A | Discharge status: Home / Self Care | Payer: Medicaid Other | Source: Ambulatory Visit | Attending: Orthopedic Surgery | Admitting: Orthopedic Surgery

## 2020-10-10 ENCOUNTER — Inpatient Hospital Stay (HOSPITAL_COMMUNITY): Payer: Medicaid Other

## 2020-10-10 ENCOUNTER — Inpatient Hospital Stay (HOSPITAL_COMMUNITY): Payer: Medicaid Other | Admitting: Certified Registered Nurse Anesthetist

## 2020-10-10 ENCOUNTER — Other Ambulatory Visit: Payer: Self-pay

## 2020-10-10 ENCOUNTER — Encounter (HOSPITAL_COMMUNITY)
Admission: RE | Disposition: A | Discharge status: Home / Self Care | Payer: Self-pay | Source: Ambulatory Visit | Attending: Orthopedic Surgery

## 2020-10-10 DIAGNOSIS — M4807 Spinal stenosis, lumbosacral region: Secondary | ICD-10-CM | POA: Diagnosis present

## 2020-10-10 DIAGNOSIS — M5117 Intervertebral disc disorders with radiculopathy, lumbosacral region: Principal | ICD-10-CM | POA: Diagnosis present

## 2020-10-10 DIAGNOSIS — F419 Anxiety disorder, unspecified: Secondary | ICD-10-CM | POA: Diagnosis present

## 2020-10-10 DIAGNOSIS — J45909 Unspecified asthma, uncomplicated: Secondary | ICD-10-CM | POA: Diagnosis present

## 2020-10-10 DIAGNOSIS — Z20822 Contact with and (suspected) exposure to covid-19: Secondary | ICD-10-CM | POA: Diagnosis present

## 2020-10-10 DIAGNOSIS — Z79899 Other long term (current) drug therapy: Secondary | ICD-10-CM

## 2020-10-10 DIAGNOSIS — F32A Depression, unspecified: Secondary | ICD-10-CM | POA: Diagnosis present

## 2020-10-10 DIAGNOSIS — M5410 Radiculopathy, site unspecified: Secondary | ICD-10-CM | POA: Diagnosis present

## 2020-10-10 DIAGNOSIS — F17210 Nicotine dependence, cigarettes, uncomplicated: Secondary | ICD-10-CM | POA: Diagnosis present

## 2020-10-10 DIAGNOSIS — K660 Peritoneal adhesions (postprocedural) (postinfection): Secondary | ICD-10-CM | POA: Diagnosis present

## 2020-10-10 DIAGNOSIS — Z419 Encounter for procedure for purposes other than remedying health state, unspecified: Secondary | ICD-10-CM

## 2020-10-10 HISTORY — PX: TRANSFORAMINAL LUMBAR INTERBODY FUSION (TLIF) WITH PEDICLE SCREW FIXATION 1 LEVEL: SHX6141

## 2020-10-10 LAB — POCT PREGNANCY, URINE: Preg Test, Ur: NEGATIVE

## 2020-10-10 SURGERY — TRANSFORAMINAL LUMBAR INTERBODY FUSION (TLIF) WITH PEDICLE SCREW FIXATION 1 LEVEL
Anesthesia: General | Site: Back | Laterality: Left

## 2020-10-10 MED ORDER — BUPIVACAINE LIPOSOME 1.3 % IJ SUSP
INTRAMUSCULAR | Status: AC
  Filled 2020-10-10: qty 20

## 2020-10-10 MED ORDER — FENTANYL CITRATE (PF) 250 MCG/5ML IJ SOLN
INTRAMUSCULAR | Status: AC
  Filled 2020-10-10: qty 5

## 2020-10-10 MED ORDER — FLUOXETINE HCL 20 MG PO CAPS
20.0000 mg | ORAL_CAPSULE | Freq: Every day | ORAL | Status: DC
  Administered 2020-10-11: 20 mg via ORAL
  Filled 2020-10-10: qty 1

## 2020-10-10 MED ORDER — ACETAMINOPHEN 10 MG/ML IV SOLN
INTRAVENOUS | Status: AC
  Filled 2020-10-10: qty 100

## 2020-10-10 MED ORDER — SODIUM CHLORIDE 0.9 % IV SOLN
250.0000 mL | INTRAVENOUS | Status: DC
  Administered 2020-10-10: 250 mL via INTRAVENOUS

## 2020-10-10 MED ORDER — OXYCODONE-ACETAMINOPHEN 5-325 MG PO TABS
1.0000 | ORAL_TABLET | ORAL | Status: DC | PRN
  Administered 2020-10-10 – 2020-10-11 (×3): 1 via ORAL
  Filled 2020-10-10 (×3): qty 1

## 2020-10-10 MED ORDER — KETAMINE HCL 50 MG/5ML IJ SOSY
PREFILLED_SYRINGE | INTRAMUSCULAR | Status: AC
  Filled 2020-10-10: qty 5

## 2020-10-10 MED ORDER — KETAMINE HCL 10 MG/ML IJ SOLN
INTRAMUSCULAR | Status: DC | PRN
  Administered 2020-10-10 (×2): 10 mg via INTRAVENOUS
  Administered 2020-10-10: 20 mg via INTRAVENOUS
  Administered 2020-10-10: 10 mg via INTRAVENOUS

## 2020-10-10 MED ORDER — OXYCODONE HCL 5 MG PO TABS: 5.0000 mg | ORAL_TABLET | Freq: Once | ORAL | Status: DC | PRN

## 2020-10-10 MED ORDER — PHENOL 1.4 % MT LIQD: 1.0000 | OROMUCOSAL | Status: DC | PRN

## 2020-10-10 MED ORDER — HYDROXYZINE HCL 50 MG/ML IM SOLN
50.0000 mg | Freq: Four times a day (QID) | INTRAMUSCULAR | Status: DC | PRN
  Administered 2020-10-10: 50 mg via INTRAMUSCULAR
  Filled 2020-10-10: qty 1

## 2020-10-10 MED ORDER — FENTANYL CITRATE (PF) 100 MCG/2ML IJ SOLN
INTRAMUSCULAR | Status: DC | PRN
  Administered 2020-10-10 (×2): 50 ug via INTRAVENOUS
  Administered 2020-10-10: 100 ug via INTRAVENOUS
  Administered 2020-10-10: 50 ug via INTRAVENOUS

## 2020-10-10 MED ORDER — MENTHOL 3 MG MT LOZG: 1.0000 | LOZENGE | OROMUCOSAL | Status: DC | PRN

## 2020-10-10 MED ORDER — OLOPATADINE HCL 0.1 % OP SOLN
1.0000 [drp] | Freq: Two times a day (BID) | OPHTHALMIC | Status: DC
  Administered 2020-10-10 – 2020-10-11 (×2): 1 [drp] via OPHTHALMIC
  Filled 2020-10-10: qty 5

## 2020-10-10 MED ORDER — PROMETHAZINE HCL 25 MG/ML IJ SOLN
INTRAMUSCULAR | Status: AC
  Filled 2020-10-10: qty 1

## 2020-10-10 MED ORDER — MIDAZOLAM HCL 2 MG/2ML IJ SOLN
INTRAMUSCULAR | Status: AC
  Filled 2020-10-10: qty 2

## 2020-10-10 MED ORDER — PROPOFOL 10 MG/ML IV BOLUS
INTRAVENOUS | Status: AC
  Filled 2020-10-10: qty 20

## 2020-10-10 MED ORDER — ONDANSETRON HCL 4 MG PO TABS: 4.0000 mg | ORAL_TABLET | Freq: Four times a day (QID) | ORAL | Status: DC | PRN

## 2020-10-10 MED ORDER — BUPIVACAINE LIPOSOME 1.3 % IJ SUSP
INTRAMUSCULAR | Status: DC | PRN
  Administered 2020-10-10: 20 mL

## 2020-10-10 MED ORDER — METHOCARBAMOL 500 MG PO TABS
500.0000 mg | ORAL_TABLET | Freq: Four times a day (QID) | ORAL | Status: DC | PRN
  Administered 2020-10-10 – 2020-10-11 (×2): 500 mg via ORAL
  Filled 2020-10-10 (×2): qty 1

## 2020-10-10 MED ORDER — DEXAMETHASONE SODIUM PHOSPHATE 10 MG/ML IJ SOLN
INTRAMUSCULAR | Status: AC
  Filled 2020-10-10: qty 1

## 2020-10-10 MED ORDER — ORAL CARE MOUTH RINSE: 15.0000 mL | Freq: Once | OROMUCOSAL | Status: AC

## 2020-10-10 MED ORDER — VITAMIN D (ERGOCALCIFEROL) 1.25 MG (50000 UNIT) PO CAPS
50000.0000 [IU] | ORAL_CAPSULE | ORAL | Status: DC
  Administered 2020-10-11: 50000 [IU] via ORAL
  Filled 2020-10-10: qty 1

## 2020-10-10 MED ORDER — DEXAMETHASONE SODIUM PHOSPHATE 10 MG/ML IJ SOLN
INTRAMUSCULAR | Status: DC | PRN
  Administered 2020-10-10: 10 mg via INTRAVENOUS

## 2020-10-10 MED ORDER — POTASSIUM CHLORIDE IN NACL 20-0.9 MEQ/L-% IV SOLN: INTRAVENOUS | Status: DC

## 2020-10-10 MED ORDER — LIDOCAINE 2% (20 MG/ML) 5 ML SYRINGE
INTRAMUSCULAR | Status: DC | PRN
  Administered 2020-10-10: 60 mg via INTRAVENOUS

## 2020-10-10 MED ORDER — ONDANSETRON HCL 4 MG/2ML IJ SOLN: 4.0000 mg | Freq: Four times a day (QID) | INTRAMUSCULAR | Status: DC | PRN

## 2020-10-10 MED ORDER — CEFAZOLIN SODIUM-DEXTROSE 2-4 GM/100ML-% IV SOLN
2.0000 g | Freq: Three times a day (TID) | INTRAVENOUS | Status: AC
  Administered 2020-10-10 – 2020-10-11 (×2): 2 g via INTRAVENOUS
  Filled 2020-10-10 (×2): qty 100

## 2020-10-10 MED ORDER — ACETAMINOPHEN 325 MG PO TABS: 650.0000 mg | ORAL_TABLET | ORAL | Status: DC | PRN

## 2020-10-10 MED ORDER — PROMETHAZINE HCL 25 MG/ML IJ SOLN
6.2500 mg | INTRAMUSCULAR | Status: DC | PRN
  Administered 2020-10-10: 12.5 mg via INTRAVENOUS

## 2020-10-10 MED ORDER — 0.9 % SODIUM CHLORIDE (POUR BTL) OPTIME
TOPICAL | Status: DC | PRN
  Administered 2020-10-10: 1000 mL

## 2020-10-10 MED ORDER — PHENYLEPHRINE 40 MCG/ML (10ML) SYRINGE FOR IV PUSH (FOR BLOOD PRESSURE SUPPORT)
PREFILLED_SYRINGE | INTRAVENOUS | Status: AC
  Filled 2020-10-10: qty 10

## 2020-10-10 MED ORDER — HEMOSTATIC AGENTS (NO CHARGE) OPTIME
TOPICAL | Status: DC | PRN
  Administered 2020-10-10: 1 via TOPICAL

## 2020-10-10 MED ORDER — ZOLPIDEM TARTRATE 5 MG PO TABS: 5.0000 mg | ORAL_TABLET | Freq: Every evening | ORAL | Status: DC | PRN

## 2020-10-10 MED ORDER — OXYCODONE HCL 5 MG/5ML PO SOLN: 5.0000 mg | Freq: Once | ORAL | Status: DC | PRN

## 2020-10-10 MED ORDER — ROCURONIUM BROMIDE 10 MG/ML (PF) SYRINGE
PREFILLED_SYRINGE | INTRAVENOUS | Status: DC | PRN
  Administered 2020-10-10 (×2): 10 mg via INTRAVENOUS
  Administered 2020-10-10: 100 mg via INTRAVENOUS
  Administered 2020-10-10: 20 mg via INTRAVENOUS
  Administered 2020-10-10: 10 mg via INTRAVENOUS

## 2020-10-10 MED ORDER — LACTATED RINGERS IV SOLN: INTRAVENOUS | Status: DC | PRN

## 2020-10-10 MED ORDER — POVIDONE-IODINE 7.5 % EX SOLN
Freq: Once | CUTANEOUS | Status: DC
  Filled 2020-10-10: qty 118

## 2020-10-10 MED ORDER — BUPIVACAINE-EPINEPHRINE 0.25% -1:200000 IJ SOLN
INTRAMUSCULAR | Status: DC | PRN
  Administered 2020-10-10: 20 mL
  Administered 2020-10-10: 5 mL

## 2020-10-10 MED ORDER — BUPIVACAINE-EPINEPHRINE (PF) 0.25% -1:200000 IJ SOLN
INTRAMUSCULAR | Status: AC
  Filled 2020-10-10: qty 30

## 2020-10-10 MED ORDER — CHLORHEXIDINE GLUCONATE 0.12 % MT SOLN: 15.0000 mL | Freq: Once | OROMUCOSAL | Status: DC

## 2020-10-10 MED ORDER — DOCUSATE SODIUM 100 MG PO CAPS
100.0000 mg | ORAL_CAPSULE | Freq: Two times a day (BID) | ORAL | Status: DC
  Administered 2020-10-10 – 2020-10-11 (×2): 100 mg via ORAL
  Filled 2020-10-10 (×2): qty 1

## 2020-10-10 MED ORDER — LORATADINE 10 MG PO TABS
10.0000 mg | ORAL_TABLET | Freq: Every day | ORAL | Status: DC
  Administered 2020-10-11: 10 mg via ORAL
  Filled 2020-10-10: qty 1

## 2020-10-10 MED ORDER — SENNOSIDES-DOCUSATE SODIUM 8.6-50 MG PO TABS: 1.0000 | ORAL_TABLET | Freq: Every evening | ORAL | Status: DC | PRN

## 2020-10-10 MED ORDER — ARIPIPRAZOLE 10 MG PO TABS
10.0000 mg | ORAL_TABLET | Freq: Every day | ORAL | Status: DC
  Administered 2020-10-11: 10 mg via ORAL
  Filled 2020-10-10: qty 1

## 2020-10-10 MED ORDER — SODIUM CHLORIDE 0.9% FLUSH: 3.0000 mL | INTRAVENOUS | Status: DC | PRN

## 2020-10-10 MED ORDER — KETOROLAC TROMETHAMINE 30 MG/ML IJ SOLN
30.0000 mg | Freq: Once | INTRAMUSCULAR | Status: AC | PRN
  Administered 2020-10-10: 30 mg via INTRAVENOUS

## 2020-10-10 MED ORDER — PHENYLEPHRINE HCL-NACL 10-0.9 MG/250ML-% IV SOLN
INTRAVENOUS | Status: DC | PRN
  Administered 2020-10-10: 20 ug/min via INTRAVENOUS

## 2020-10-10 MED ORDER — OXYCODONE-ACETAMINOPHEN 5-325 MG PO TABS: 1.0000 | ORAL_TABLET | Freq: Four times a day (QID) | ORAL | Status: DC | PRN

## 2020-10-10 MED ORDER — BISACODYL 5 MG PO TBEC: 5.0000 mg | DELAYED_RELEASE_TABLET | Freq: Every day | ORAL | Status: DC | PRN

## 2020-10-10 MED ORDER — ORAL CARE MOUTH RINSE: 15.0000 mL | Freq: Once | OROMUCOSAL | Status: DC

## 2020-10-10 MED ORDER — ACETAMINOPHEN 650 MG RE SUPP: 650.0000 mg | RECTAL | Status: DC | PRN

## 2020-10-10 MED ORDER — SODIUM CHLORIDE 0.9% FLUSH
3.0000 mL | Freq: Two times a day (BID) | INTRAVENOUS | Status: DC
  Administered 2020-10-10: 3 mL via INTRAVENOUS

## 2020-10-10 MED ORDER — ACETAMINOPHEN 10 MG/ML IV SOLN
INTRAVENOUS | Status: DC | PRN
  Administered 2020-10-10: 1000 mg via INTRAVENOUS

## 2020-10-10 MED ORDER — HYDROMORPHONE HCL 1 MG/ML IJ SOLN
INTRAMUSCULAR | Status: AC
  Filled 2020-10-10: qty 1

## 2020-10-10 MED ORDER — PROPOFOL 10 MG/ML IV BOLUS
INTRAVENOUS | Status: DC | PRN
  Administered 2020-10-10: 150 mg via INTRAVENOUS

## 2020-10-10 MED ORDER — METHOCARBAMOL 500 MG PO TABS
ORAL_TABLET | ORAL | Status: AC
  Administered 2020-10-10: 500 mg via ORAL
  Filled 2020-10-10: qty 1

## 2020-10-10 MED ORDER — PHENYLEPHRINE 40 MCG/ML (10ML) SYRINGE FOR IV PUSH (FOR BLOOD PRESSURE SUPPORT)
PREFILLED_SYRINGE | INTRAVENOUS | Status: DC | PRN
  Administered 2020-10-10 (×2): 120 ug via INTRAVENOUS
  Administered 2020-10-10 (×2): 80 ug via INTRAVENOUS

## 2020-10-10 MED ORDER — HYDROMORPHONE HCL 1 MG/ML IJ SOLN
0.2500 mg | INTRAMUSCULAR | Status: DC | PRN
  Administered 2020-10-10: 0.5 mg via INTRAVENOUS

## 2020-10-10 MED ORDER — CHLORHEXIDINE GLUCONATE 0.12 % MT SOLN
15.0000 mL | Freq: Once | OROMUCOSAL | Status: AC
  Administered 2020-10-10: 15 mL via OROMUCOSAL
  Filled 2020-10-10: qty 15

## 2020-10-10 MED ORDER — KETOROLAC TROMETHAMINE 30 MG/ML IJ SOLN
INTRAMUSCULAR | Status: AC
  Filled 2020-10-10: qty 1

## 2020-10-10 MED ORDER — CEFAZOLIN SODIUM-DEXTROSE 2-4 GM/100ML-% IV SOLN
2.0000 g | INTRAVENOUS | Status: AC
  Administered 2020-10-10 (×2): 2 g via INTRAVENOUS
  Filled 2020-10-10: qty 100

## 2020-10-10 MED ORDER — ONDANSETRON HCL 4 MG/2ML IJ SOLN
INTRAMUSCULAR | Status: AC
  Filled 2020-10-10: qty 2

## 2020-10-10 MED ORDER — ONDANSETRON HCL 4 MG/2ML IJ SOLN
INTRAMUSCULAR | Status: DC | PRN
  Administered 2020-10-10: 4 mg via INTRAVENOUS

## 2020-10-10 MED ORDER — THROMBIN 20000 UNITS EX SOLR
CUTANEOUS | Status: DC | PRN
  Administered 2020-10-10: 20000 [IU] via TOPICAL

## 2020-10-10 MED ORDER — MORPHINE SULFATE (PF) 2 MG/ML IV SOLN
1.0000 mg | INTRAVENOUS | Status: DC | PRN
  Administered 2020-10-10: 2 mg via INTRAVENOUS
  Filled 2020-10-10: qty 1

## 2020-10-10 MED ORDER — ALUM & MAG HYDROXIDE-SIMETH 200-200-20 MG/5ML PO SUSP: 30.0000 mL | Freq: Four times a day (QID) | ORAL | Status: DC | PRN

## 2020-10-10 MED ORDER — LACTATED RINGERS IV SOLN: INTRAVENOUS | Status: DC

## 2020-10-10 MED ORDER — MEPERIDINE HCL 25 MG/ML IJ SOLN: 6.2500 mg | INTRAMUSCULAR | Status: DC | PRN

## 2020-10-10 MED ORDER — SUGAMMADEX SODIUM 200 MG/2ML IV SOLN
INTRAVENOUS | Status: DC | PRN
  Administered 2020-10-10: 400 mg via INTRAVENOUS

## 2020-10-10 MED ORDER — FLEET ENEMA 7-19 GM/118ML RE ENEM: 1.0000 | ENEMA | Freq: Once | RECTAL | Status: DC | PRN

## 2020-10-10 MED ORDER — MIDAZOLAM HCL 2 MG/2ML IJ SOLN
INTRAMUSCULAR | Status: DC | PRN
  Administered 2020-10-10: 2 mg via INTRAVENOUS

## 2020-10-10 MED ORDER — THROMBIN 20000 UNITS EX SOLR
CUTANEOUS | Status: AC
  Filled 2020-10-10: qty 20000

## 2020-10-10 MED ORDER — HYDROCODONE-ACETAMINOPHEN 5-325 MG PO TABS
1.0000 | ORAL_TABLET | ORAL | Status: DC | PRN
  Administered 2020-10-10: 1 via ORAL
  Filled 2020-10-10: qty 1

## 2020-10-10 MED ORDER — METHYLENE BLUE 0.5 % INJ SOLN
INTRAVENOUS | Status: AC
  Filled 2020-10-10: qty 10

## 2020-10-10 SURGICAL SUPPLY — 89 items
AGENT HMST KT MTR STRL THRMB (HEMOSTASIS)
APL SKNCLS STERI-STRIP NONHPOA (GAUZE/BANDAGES/DRESSINGS) ×1
BENZOIN TINCTURE PRP APPL 2/3 (GAUZE/BANDAGES/DRESSINGS) ×2 IMPLANT
BLADE CLIPPER SURG (BLADE) IMPLANT
BONE VIVIGEN FORMABLE 1.3CC (Bone Implant) ×2 IMPLANT
BUR PRESCISION 1.7 ELITE (BURR) ×2 IMPLANT
BUR ROUND FLUTED 5 RND (BURR) ×2 IMPLANT
BUR ROUND PRECISION 4.0 (BURR) IMPLANT
BUR SABER RD CUTTING 3.0 (BURR) IMPLANT
CAGE CONCORDE BULLET 9X8X27 (Cage) ×2 IMPLANT
CAGE SPNL 5D BLT NOSE 27X9X8X (Cage) IMPLANT
CARTRIDGE OIL MAESTRO DRILL (MISCELLANEOUS) ×1 IMPLANT
CNTNR URN SCR LID CUP LEK RST (MISCELLANEOUS) ×1 IMPLANT
CONT SPEC 4OZ STRL OR WHT (MISCELLANEOUS) ×2
COVER BACK TABLE 60X90IN (DRAPES) ×2 IMPLANT
COVER MAYO STAND STRL (DRAPES) ×4 IMPLANT
COVER SURGICAL LIGHT HANDLE (MISCELLANEOUS) ×2 IMPLANT
COVER WAND RF STERILE (DRAPES) ×2 IMPLANT
DIFFUSER DRILL AIR PNEUMATIC (MISCELLANEOUS) ×2 IMPLANT
DRAIN CHANNEL 15F RND FF W/TCR (WOUND CARE) IMPLANT
DRAPE C-ARM 42X72 X-RAY (DRAPES) ×2 IMPLANT
DRAPE C-ARMOR (DRAPES) IMPLANT
DRAPE POUCH INSTRU U-SHP 10X18 (DRAPES) ×2 IMPLANT
DRAPE SURG 17X23 STRL (DRAPES) ×8 IMPLANT
DURAPREP 26ML APPLICATOR (WOUND CARE) ×2 IMPLANT
ELECT BLADE 4.0 EZ CLEAN MEGAD (MISCELLANEOUS) ×2
ELECT CAUTERY BLADE 6.4 (BLADE) ×2 IMPLANT
ELECT REM PT RETURN 9FT ADLT (ELECTROSURGICAL) ×2
ELECTRODE BLDE 4.0 EZ CLN MEGD (MISCELLANEOUS) ×1 IMPLANT
ELECTRODE REM PT RTRN 9FT ADLT (ELECTROSURGICAL) ×1 IMPLANT
EVACUATOR SILICONE 100CC (DRAIN) IMPLANT
FILTER STRAW FLUID ASPIR (MISCELLANEOUS) ×2 IMPLANT
GAUZE 4X4 16PLY RFD (DISPOSABLE) ×2 IMPLANT
GAUZE SPONGE 4X4 12PLY STRL (GAUZE/BANDAGES/DRESSINGS) ×2 IMPLANT
GAUZE SPONGE 4X4 12PLY STRL LF (GAUZE/BANDAGES/DRESSINGS) ×1 IMPLANT
GLOVE BIO SURGEON STRL SZ7 (GLOVE) ×2 IMPLANT
GLOVE BIO SURGEON STRL SZ8 (GLOVE) ×2 IMPLANT
GLOVE SRG 8 PF TXTR STRL LF DI (GLOVE) ×1 IMPLANT
GLOVE SURG UNDER POLY LF SZ7 (GLOVE) ×2 IMPLANT
GLOVE SURG UNDER POLY LF SZ8 (GLOVE) ×2
GOWN STRL REUS W/ TWL LRG LVL3 (GOWN DISPOSABLE) ×2 IMPLANT
GOWN STRL REUS W/ TWL XL LVL3 (GOWN DISPOSABLE) ×1 IMPLANT
GOWN STRL REUS W/TWL LRG LVL3 (GOWN DISPOSABLE) ×4
GOWN STRL REUS W/TWL XL LVL3 (GOWN DISPOSABLE) ×2
GRAFT BNE MATRIX VG FRMBL SM 1 (Bone Implant) IMPLANT
IV CATH 14GX2 1/4 (CATHETERS) ×2 IMPLANT
KIT BASIN OR (CUSTOM PROCEDURE TRAY) ×2 IMPLANT
KIT POSITION SURG JACKSON T1 (MISCELLANEOUS) ×2 IMPLANT
KIT TURNOVER KIT B (KITS) ×2 IMPLANT
MARKER SKIN DUAL TIP RULER LAB (MISCELLANEOUS) ×4 IMPLANT
NDL 18GX1X1/2 (RX/OR ONLY) (NEEDLE) ×1 IMPLANT
NDL HYPO 25GX1X1/2 BEV (NEEDLE) ×1 IMPLANT
NDL SPNL 18GX3.5 QUINCKE PK (NEEDLE) ×2 IMPLANT
NEEDLE 18GX1X1/2 (RX/OR ONLY) (NEEDLE) ×2 IMPLANT
NEEDLE 22X1 1/2 (OR ONLY) (NEEDLE) ×4 IMPLANT
NEEDLE HYPO 25GX1X1/2 BEV (NEEDLE) ×2 IMPLANT
NEEDLE SPNL 18GX3.5 QUINCKE PK (NEEDLE) ×4 IMPLANT
NS IRRIG 1000ML POUR BTL (IV SOLUTION) ×2 IMPLANT
OIL CARTRIDGE MAESTRO DRILL (MISCELLANEOUS) ×2
PACK LAMINECTOMY ORTHO (CUSTOM PROCEDURE TRAY) ×2 IMPLANT
PACK UNIVERSAL I (CUSTOM PROCEDURE TRAY) ×2 IMPLANT
PAD ARMBOARD 7.5X6 YLW CONV (MISCELLANEOUS) ×4 IMPLANT
PATTIES SURGICAL .5 X1 (DISPOSABLE) ×2 IMPLANT
PATTIES SURGICAL .5X1.5 (GAUZE/BANDAGES/DRESSINGS) ×2 IMPLANT
ROD SPINAL EXPEDIUM 5.5X35 (Rod) ×2 IMPLANT
SCREW SET SINGLE INNER (Screw) ×4 IMPLANT
SCREW VIPER CORT FIX 6.00X30 (Screw) ×2 IMPLANT
SCREW VIPER CORT FIX 6X35 (Screw) ×2 IMPLANT
SPONGE INTESTINAL PEANUT (DISPOSABLE) ×2 IMPLANT
SPONGE SURGIFOAM ABS GEL 100 (HEMOSTASIS) ×2 IMPLANT
STRIP CLOSURE SKIN 1/2X4 (GAUZE/BANDAGES/DRESSINGS) ×3 IMPLANT
SURGIFLO W/THROMBIN 8M KIT (HEMOSTASIS) IMPLANT
SUT MNCRL AB 4-0 PS2 18 (SUTURE) ×2 IMPLANT
SUT VIC AB 0 CT1 18XCR BRD 8 (SUTURE) ×1 IMPLANT
SUT VIC AB 0 CT1 8-18 (SUTURE) ×2
SUT VIC AB 1 CT1 18XCR BRD 8 (SUTURE) ×1 IMPLANT
SUT VIC AB 1 CT1 8-18 (SUTURE) ×2
SUT VIC AB 2-0 CT2 18 VCP726D (SUTURE) ×2 IMPLANT
SYR 20ML LL LF (SYRINGE) ×4 IMPLANT
SYR BULB IRRIG 60ML STRL (SYRINGE) ×2 IMPLANT
SYR CONTROL 10ML LL (SYRINGE) ×4 IMPLANT
SYR TB 1ML LUER SLIP (SYRINGE) ×2 IMPLANT
TAP EXPEDIUM DL 4.35 (INSTRUMENTS) ×1 IMPLANT
TAP EXPEDIUM DL 5.0 (INSTRUMENTS) ×1 IMPLANT
TAP EXPEDIUM DL 6.0 (INSTRUMENTS) ×1 IMPLANT
TAPE CLOTH 4X10 WHT NS (GAUZE/BANDAGES/DRESSINGS) ×1 IMPLANT
TRAY FOLEY MTR SLVR 16FR STAT (SET/KITS/TRAYS/PACK) ×2 IMPLANT
WATER STERILE IRR 1000ML POUR (IV SOLUTION) ×2 IMPLANT
YANKAUER SUCT BULB TIP NO VENT (SUCTIONS) ×2 IMPLANT

## 2020-10-10 NOTE — H&P (Signed)
PREOPERATIVE H&P  Chief Complaint: Left leg pain  HPI: Becky Pham is a 40 y.o. female who presents with ongoing pain in the left leg  MRI reveals stenosis at at L5/S1. Patient is s/p a previous decompression at L5/S1.  Patient has failed multiple forms of conservative care and continues to have pain (see office notes for additional details regarding the patient's full course of treatment)  Past Medical History:  Diagnosis Date  . Anemia   . Anxiety   . Asthma   . Back pain   . Bronchitis   . Depression    Past Surgical History:  Procedure Laterality Date  . LUMBAR LAMINECTOMY/DECOMPRESSION MICRODISCECTOMY Left 05/15/2020   Procedure: LEFT-SIDED LUMBAR 5 - SACRUM 1 MICRODISECTOMY;  Surgeon: Estill Bamberg, MD;  Location: [facility] OR;  Service: Orthopedics;  Laterality: Left;  . TUBAL LIGATION     Social History   Socioeconomic History  . Marital status: Single    Spouse name: Not on file  . Number of children: Not on file  . Years of education: Not on file  . Highest education level: Not on file  Occupational History  . Not on file  Tobacco Use  . Smoking status: Current Some Day Smoker    Packs/day: 0.25    Types: Cigarettes  . Smokeless tobacco: Never Used  Vaping Use  . Vaping Use: Never used  Substance and Sexual Activity  . Alcohol use: Yes    Comment: occ  . Drug use: Not Currently    Types: Marijuana, "Crack" cocaine    Comment: > 5 years ago  . Sexual activity: Not on file  Other Topics Concern  . Not on file  Social History Narrative  . Not on file   Social Determinants of Health   Financial Resource Strain: Not on file  Food Insecurity: Not on file  Transportation Needs: Not on file  Physical Activity: Not on file  Stress: Not on file  Social Connections: Not on file   Family History  Family history unknown: Yes   No Known Allergies Prior to Admission medications   Medication Sig Start Date End Date Taking? Authorizing Provider   acetaminophen (TYLENOL) 500 MG tablet Take 1,000 mg by mouth every 6 (six) hours as needed for moderate pain.   Yes [provider]  ARIPiprazole (ABILIFY) 10 MG tablet Take 10 mg by mouth daily. 09/26/20  Yes [provider]  cetirizine (ZYRTEC) 10 MG tablet Take 10 mg by mouth daily.   Yes [provider]  FLUoxetine (PROZAC) 20 MG capsule Take 20 mg by mouth daily. 09/26/20  Yes [provider]  loratadine (CLARITIN) 10 MG tablet Take 10 mg by mouth daily.   Yes [provider]  methocarbamol (ROBAXIN) 500 MG tablet Take 1 tablet (500 mg total) by mouth every 6 (six) hours as needed for muscle spasms. 05/15/20  Yes McKenzie, Dorathy Daft J, PA-C  olopatadine (PATANOL) 0.1 % ophthalmic solution Place 1 drop into both eyes 2 (two) times daily.   Yes [provider]  Vitamin D, Ergocalciferol, (DRISDOL) 1.25 MG (50000 UNIT) CAPS capsule Take 5,000 Units by mouth once a week. 09/26/20  Yes [provider]     All other systems have been reviewed and were otherwise negative with the exception of those mentioned in the HPI and as above.  Physical Exam: There were no vitals filed for this visit.  Body mass index is 28.87 kg/m.  General: Alert, no acute distress  Cardiovascular: No pedal edema Respiratory: No cyanosis, no use of accessory musculature Skin: No lesions in the area of chief complaint Neurologic: Sensation intact distally Psychiatric: Patient is competent for consent with normal mood and affect Lymphatic: No axillary or cervical lymphadenopathy  MUSCULOSKELETAL: + SLR on the left  Assessment/Plan: SEVERE LEFT SACRUM 1 RADICULOPATHY Plan for Procedure(s): LEFT-LUMBAR 5 - SACRUM 1 TRANSFORAMINAL LUMBAR INTERBODY FUSION WITH INSTRUMENTATION AND ALLOGRAFT   Jackelyn Hoehn, MD 10/10/2020 6:41 AM

## 2020-10-10 NOTE — Anesthesia Preprocedure Evaluation (Addendum)
Anesthesia Evaluation  Patient identified by MRN, date of birth, ID band Patient awake    Reviewed: Allergy & Precautions, NPO status , Patient's Chart, lab work & pertinent test results  Airway Mallampati: II  TM Distance: >3 FB Neck ROM: Full    Dental  (+) Chipped, Dental Advisory Given,  Tongue ring in place- pt unable to remove:   Pulmonary asthma , Current Smoker and Patient abstained from smoking.,    Pulmonary exam normal breath sounds clear to auscultation       Cardiovascular negative cardio ROS Normal cardiovascular exam Rhythm:Regular Rate:Normal     Neuro/Psych PSYCHIATRIC DISORDERS Anxiety Depression negative neurological ROS     GI/Hepatic negative GI ROS, Neg liver ROS,   Endo/Other  negative endocrine ROS  Renal/GU negative Renal ROS  negative genitourinary   Musculoskeletal Severe L S1 radiculopathy    Abdominal   Peds negative pediatric ROS (+)  Hematology  (+) Blood dyscrasia, anemia , hct 34.6   Anesthesia Other Findings   Reproductive/Obstetrics negative OB ROS                            Anesthesia Physical Anesthesia Plan  ASA: II  Anesthesia Plan: General   Post-op Pain Management:    Induction: Intravenous  PONV Risk Score and Plan: 2 and Ondansetron, Dexamethasone, Midazolam and Treatment may vary due to age or medical condition  Airway Management Planned: Oral ETT  Additional Equipment: None  Intra-op Plan:   Post-operative Plan: Extubation in OR  Informed Consent: I have reviewed the patients History and Physical, chart, labs and discussed the procedure including the risks, benefits and alternatives for the proposed anesthesia with the patient or authorized representative who has indicated his/her understanding and acceptance.     Dental advisory given  Plan Discussed with: CRNA  Anesthesia Plan Comments:         Anesthesia Quick  Evaluation

## 2020-10-10 NOTE — Anesthesia Procedure Notes (Signed)
Procedure Name: Intubation Date/Time: 10/10/2020 7:51 AM Performed by: Leonor Liv, CRNA Pre-anesthesia Checklist: Patient identified, Emergency Drugs available, Suction available and Patient being monitored Patient Re-evaluated:Patient Re-evaluated prior to induction Oxygen Delivery Method: Circle System Utilized Preoxygenation: Pre-oxygenation with 100% oxygen Induction Type: IV induction Ventilation: Mask ventilation without difficulty Laryngoscope Size: Mac and 3 Grade View: Grade I Tube type: Oral Tube size: 7.0 mm Number of attempts: 1 Airway Equipment and Method: Stylet and Oral airway Placement Confirmation: ETT inserted through vocal cords under direct vision,  positive ETCO2 and breath sounds checked- equal and bilateral Secured at: 21 cm Tube secured with: Tape Dental Injury: Teeth and Oropharynx as per pre-operative assessment

## 2020-10-10 NOTE — Op Note (Signed)
PATIENT NAME: Becky Pham   MEDICAL RECORD NO.:   [id number]   DATE OF BIRTH: 01-16-1981   DATE OF PROCEDURE: 10/10/2020                               OPERATIVE REPORT   PREOPERATIVE DIAGNOSES: 1. Left-sided S1 radiculopathy 2. L5-S1 degenerative disc disease 3. Status post previous bilateral L5-S1 microdiscectomy,  4. Large recurrent left L5/S1 HNP   POSTOPERATIVE DIAGNOSES: 1. Left-sided S1 radiculopathy 2. L5-S1 degenerative disc disease 3. Status post previous L5-S1 microdiscectomy 4. Large recurrent and very adherent left L5/S1 HNP   PROCEDURES: 1. Left-sided L5-S1 transforaminal lumbar interbody fusion. 2. Right-sided L5-S1 posterolateral fusion. 3. Complex revision L5/S1 decompression with removal of very large left L5/S1 HNP 4. Insertion of interbody device x1 (8 x 27 mm Concorde     intervertebral spacer). 5. Placement of posterior instrumentation at L5, S1 bilaterally. 6. Use of local autograft. 7. Use of morselized allograft - ViviGen. 8. Intraoperative use of fluoroscopy.   SURGEON:  Estill Bamberg, MD.   ASSISTANTJason Coop, PA-C.   ANESTHESIA:  General endotracheal anesthesia.   COMPLICATIONS:  None.   DISPOSITION:  Stable.   ESTIMATED BLOOD LOSS:   Minimal   INDICATIONS FOR SURGERY:  Briefly, Ms. Nicolls is a pleasant 40 year old female who did present to me with severe and ongoing pain in the left leg.  Of note, she is status post a previous microdiscectomy at L5-S1.  She did very well after surgery, but recently did have a recurrence of her left leg pain.  An updated MRI did reveal a very large recurrent left L5-S1 disc herniation. We did have an extensive discussion about surgery, and she did elect to proceed with the procedure noted above.      OPERATIVE DETAILS:  On 10/10/2020, the patient was brought to surgery and general endotracheal anesthesia was administered.  The patient was placed prone on a well-padded flat Jackson bed with a  spinal frame.  Antibiotics were given and a time-out procedure was performed. The back was prepped and draped in the usual fashion.  A midline incision was made overlying the L5-S1 intervertebral space, in line with the patient's previous incision.  The fascia was incised at the midline.  The paraspinal musculature was bluntly swept laterally.  Anatomic landmarks for the pedicles were exposed. Using fluoroscopy, I did cannulate the L5 and S1 pedicles bilaterally, using a medial to lateral cortical trajectory technique.  On the right side, the posterolateral gutter and right facet joint at L5/S1 was decorticated and 6 x 30 mm screws were placed and a 35-mm rod was placed and distraction was applied across the rod on the right side.  On the left side, the cannulated pedicle holes were filled with bone wax.  I then proceeded with the decompressive aspect of the procedure.   Of note, this was an extremely meticulous portion of the procedure.  After performing a full facetectomy on the left, there was abundant scar tissue with very extensive adhesions in the region of the left lateral recess.  The very large herniated disc fragment was extremely adherent to the traversing left S1 nerve, and the dura.  Safely developing a plane between the herniated disc in the nerve and dura took approximately 90 minutes.  Normally, this portion of the procedure takes no more than 5 minutes, however, developing a safe plane was extremely meticulous.  I was however  able to safely develop a plane, after which point a very large L5-S1 disc herniation was identified and uneventfully removed.  At this point, with an assistant holding medial retraction of the traversing left S1 nerve, I did perform an liberal annulotomy at the posterolateral aspect of the L5/S1 intervertebral space.  I then used a series of curettes and pituitary rongeurs to perform a thorough and complete intervertebral diskectomy.  The intervertebral space  was then liberally packed with autograft as well as allograft in the form of ViviGen, as was the appropriate-sized intervertebral spacer.  The spacer was then tamped into position in the usual fashion.  I was very pleased with the press-fit of the spacer.  I then placed 6 x 30 mm screws on the left at L5 and S1.  A 35-mm rod was then placed and caps were placed. The distraction was then released on the contralateral right side.  All 4 caps were then locked.  The wound was copiously irrigated with a total of approximately 3 L prior to placing the bone graft.  Additional autograft and allograft were then packed into the posterolateral gutter on the right side to help aid in the L5-S1 fusion.  The wound was explored for any undue bleeding and there was no substantial bleeding encountered.  Gel-Foam was placed over the laminectomy site.  The wound was then closed in layers using #1 Vicryl followed by 2-0 Vicryl, followed by 4-0 Monocryl.  Benzoin and Steri-Strips were applied followed by sterile dressing.    Of note, Jason Coop was my assistant throughout surgery, and did aid in retraction, placement of the hardware, suctioning, and closure.       Estill Bamberg, MD

## 2020-10-10 NOTE — Anesthesia Postprocedure Evaluation (Signed)
Anesthesia Post Note  Patient: Becky Pham  Procedure(s) Performed: LEFT-LUMBAR 5 - SACRUM 1 TRANSFORAMINAL LUMBAR INTERBODY FUSION WITH INSTRUMENTATION AND ALLOGRAFT (Left Back)     Patient location during evaluation: PACU Anesthesia Type: General Level of consciousness: awake and alert, oriented and patient cooperative Pain management: pain level controlled Vital Signs Assessment: post-procedure vital signs reviewed and stable Respiratory status: spontaneous breathing, nonlabored ventilation and respiratory function stable Cardiovascular status: blood pressure returned to baseline and stable Postop Assessment: no apparent nausea or vomiting Anesthetic complications: no   No complications documented.  Last Vitals:  Vitals:   10/10/20 1355 10/10/20 1424  BP: (!) 96/57 125/86  Pulse: 71 76  Resp: 17 18  Temp: 36.7 C   SpO2: 91% 100%    Last Pain:  Vitals:   10/10/20 1355  PainSc: Asleep                 Lannie Fields

## 2020-10-10 NOTE — Transfer of Care (Signed)
Immediate Anesthesia Transfer of Care Note  Patient: Becky Pham  Procedure(s) Performed: LEFT-LUMBAR 5 - SACRUM 1 TRANSFORAMINAL LUMBAR INTERBODY FUSION WITH INSTRUMENTATION AND ALLOGRAFT (Left Back)  Patient Location: PACU  Anesthesia Type:General  Level of Consciousness: drowsy and patient cooperative  Airway & Oxygen Therapy: Patient Spontanous Breathing and Patient connected to nasal cannula oxygen  Post-op Assessment: Report given to RN and Post -op Vital signs reviewed and stable  Post vital signs: Reviewed and stable  Last Vitals:  Vitals Value Taken Time  BP 100/62 10/10/20 1254  Temp    Pulse 84 10/10/20 1255  Resp 19 10/10/20 1255  SpO2 100 % 10/10/20 1255  Vitals shown include unvalidated device data.  Last Pain:  Vitals:   10/10/20 0622  PainSc: 3       Patients Stated Pain Goal: 5 (10/10/20 0622)  Complications: No complications documented.

## 2020-10-11 ENCOUNTER — Encounter (HOSPITAL_COMMUNITY): Payer: Self-pay | Admitting: Orthopedic Surgery

## 2020-10-11 MED ORDER — METHOCARBAMOL 500 MG PO TABS: 500.0000 mg | ORAL_TABLET | Freq: Four times a day (QID) | ORAL | 2 refills | Status: AC | PRN

## 2020-10-11 MED ORDER — OXYCODONE-ACETAMINOPHEN 5-325 MG PO TABS: 1.0000 | ORAL_TABLET | ORAL | 0 refills | Status: AC | PRN

## 2020-10-11 NOTE — Evaluation (Signed)
Physical Therapy Evaluation and Discharge Patient Details Name: Becky Pham MRN: [id number] DOB: Oct 06, 1980 Today's Date: 10/11/2020   History of Present Illness  Pt is a 40 yo female who presents with ongoing pain in the left leg. 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 S/p L5-S1 fusion and decompression on 4/28. PMH icnluding anxiety, asthma, bronchitis, depression and s/p a previous decompression at L5/S1 in Dec.  Clinical Impression  Patient evaluated by Physical Therapy with no further acute PT needs identified. All education has been completed and the patient has no further questions. Pt was able to demonstrate transfers and ambulation with independence and no AD. Brace adjusted for optimal fit. Pt was educated on precautions, brace application/wearing schedule, appropriate activity progression, and car transfer. See below for any follow-up Physical Therapy or equipment needs. PT is signing off. Thank you for this referral.     Follow Up Recommendations No PT follow up;Supervision - Intermittent    Equipment Recommendations  None recommended by PT    Recommendations for Other Services       Precautions / Restrictions Precautions Precautions: Back Precaution Booklet Issued: Yes (comment) Precaution Comments: Reviewed hand out and back precautions Required Braces or Orthoses: Spinal Brace Spinal Brace: Applied in sitting position;Thoracolumbosacral orthotic Restrictions Weight Bearing Restrictions: No      Mobility  Bed Mobility Overal bed mobility: Independent             General bed mobility comments: Pt was recevied standing in the room    Transfers Overall transfer level: Independent Equipment used: None             General transfer comment: No  Ambulation/Gait Ambulation/Gait assistance: Independent           General Gait Details: Observed pt ambulating in the hallway with OT  Stairs            Wheelchair Mobility    Modified Rankin  (Stroke Patients Only)       Balance Overall balance assessment: Independent                                           Pertinent Vitals/Pain Pain Assessment: Faces Faces Pain Scale: Hurts a little bit Pain Intervention(s): Limited activity within patient's tolerance;Monitored during session;Repositioned    Home Living Family/patient expects to be discharged to:: Private residence Living Arrangements: Spouse/significant other Available Help at Discharge: Family;Available 24 hours/day Type of Home: House Home Access: Level entry     Home Layout: One level Home Equipment: None      Prior Function Level of Independence: Independent               Hand Dominance   Dominant Hand: Right    Extremity/Trunk Assessment   Upper Extremity Assessment Upper Extremity Assessment: Overall WFL for tasks assessed    Lower Extremity Assessment Lower Extremity Assessment: Overall WFL for tasks assessed    Cervical / Trunk Assessment Cervical / Trunk Assessment: Other exceptions Cervical / Trunk Exceptions: s/p back sx  Communication   Communication: No difficulties  Cognition Arousal/Alertness: Awake/alert Behavior During Therapy: WFL for tasks assessed/performed Overall Cognitive Status: Within Functional Limits for tasks assessed  General Comments General comments (skin integrity, edema, etc.): bandage in place    Exercises     Assessment/Plan    PT Assessment Patent does not need any further PT services  PT Problem List         PT Treatment Interventions      PT Goals (Current goals can be found in the Care Plan section)  Acute Rehab PT Goals Patient Stated Goal: Go home PT Goal Formulation: All assessment and education complete, DC therapy    Frequency     Barriers to discharge        Co-evaluation               AM-PAC PT "6 Clicks" Mobility  Outcome Measure Help  needed turning from your back to your side while in a flat bed without using bedrails?: None Help needed moving from lying on your back to sitting on the side of a flat bed without using bedrails?: None Help needed moving to and from a bed to a chair (including a wheelchair)?: None Help needed standing up from a chair using your arms (e.g., wheelchair or bedside chair)?: None Help needed to walk in hospital room?: None Help needed climbing 3-5 steps with a railing? : None 6 Click Score: 24    End of Session Equipment Utilized During Treatment: Back brace Activity Tolerance: Patient tolerated treatment well Patient left: in bed;with call bell/phone within reach Nurse Communication: Mobility status PT Visit Diagnosis: Unsteadiness on feet (R26.81);Pain Pain - part of body:  (Back)    Time: 9924-2683 PT Time Calculation (min) (ACUTE ONLY): 8 min   Charges:   PT Evaluation $PT Eval Low Complexity: 1 Low          Becky Pham, PT, DPT Acute Rehabilitation Services Pager: [phone] Office: [phone]   Becky Pham 10/11/2020, 10:22 AM

## 2020-10-11 NOTE — Progress Notes (Signed)
    Patient doing well Denies leg pain Minimal back pain   Physical Exam: Vitals:   10/11/20 0404 10/11/20 0721  BP: 124/82 120/84  Pulse: 77 75  Resp: 20 18  Temp: 99.2 F (37.3 C) 98.3 F (36.8 C)  SpO2: 100% 100%    Dressing in place NVI  POD #1 s/p revision decompression and fusion, doing very well  - up with PT/OT, encourage ambulation - Percocet for pain, Robaxin for muscle spasms - likely d/c home today with f/u in 2 weeks

## 2020-10-11 NOTE — Evaluation (Signed)
Occupational Therapy Evaluation Patient Details Name: Becky Pham MRN: [id number] DOB: [birth year] Today's Date: 10/11/2020    History of Present Illness 40 yo female who presents with ongoing pain in the left leg. 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 S/p L5-S1 fusion and decompression on 4/28. PMH icnluding anxiety, asthma, bronchitis, depression and s/p a previous decompression at L5/S1 in Dec.   Clinical Impression   PTA, pt was living with her boyfriend and daughter Becky Pham) and was independent. Currently, pt performing ADLs and functional mobility at Mod I level. Provided education and handout on back precautions, bed mobility, brace management, LB ADLs, toilet, and tub transfer; pt demonstrated understanding. Answered all pt questions. Recommend dc home once medically stable per physician. All acute OT needs met and will sign off. Thank you.    Follow Up Recommendations  No OT follow up    Equipment Recommendations  Tub/shower seat    Recommendations for Other Services       Precautions / Restrictions Precautions Precautions: Back Precaution Booklet Issued: Yes (comment) Precaution Comments: Reviewed hand out and back precautions Required Braces or Orthoses: Spinal Brace Spinal Brace: Lumbar corset;Applied in sitting position      Mobility Bed Mobility Overal bed mobility: Independent             General bed mobility comments: Pt performing bed mobility using log roll    Transfers Overall transfer level: Independent                    Balance Overall balance assessment: Independent                                         ADL either performed or assessed with clinical judgement   ADL Overall ADL's : Modified independent                                       General ADL Comments: Providing education and handout on back precautions, brace management, bed mobility, UB ADLs, LB ADLs, toileting, and tub transfer.      Vision         Perception     Praxis      Pertinent Vitals/Pain Pain Assessment: Faces Faces Pain Scale: Hurts a little bit Pain Intervention(s): Monitored during session     Hand Dominance Right   Extremity/Trunk Assessment Upper Extremity Assessment Upper Extremity Assessment: Overall WFL for tasks assessed   Lower Extremity Assessment Lower Extremity Assessment: Overall WFL for tasks assessed   Cervical / Trunk Assessment Cervical / Trunk Assessment: Other exceptions Cervical / Trunk Exceptions: s/p back sx   Communication Communication Communication: No difficulties   Cognition Arousal/Alertness: Awake/alert Behavior During Therapy: WFL for tasks assessed/performed Overall Cognitive Status: Within Functional Limits for tasks assessed                                     General Comments  bandage in place    Exercises     Shoulder Instructions      Home Living Family/patient expects to be discharged to:: Private residence Living Arrangements: Spouse/significant other Available Help at Discharge: Family;Available 24 hours/day Type of Home: House Home Access: Level entry     Home Layout: One  level     Bathroom Shower/Tub: Teacher, early years/pre: Standard     Home Equipment: None          Prior Functioning/Environment Level of Independence: Independent                 OT Problem List: Decreased activity tolerance;Pain      OT Treatment/Interventions:      OT Goals(Current goals can be found in the care plan section) Acute Rehab OT Goals Patient Stated Goal: Go home OT Goal Formulation: All assessment and education complete, DC therapy  OT Frequency:     Barriers to D/C:            Co-evaluation              AM-PAC OT "6 Clicks" Daily Activity     Outcome Measure Help from another person eating meals?: None Help from another person taking care of personal grooming?: None Help from  another person toileting, which includes using toliet, bedpan, or urinal?: None Help from another person bathing (including washing, rinsing, drying)?: None Help from another person to put on and taking off regular upper body clothing?: None Help from another person to put on and taking off regular lower body clothing?: None 6 Click Score: 24   End of Session Equipment Utilized During Treatment: Back brace Nurse Communication: Mobility status  Activity Tolerance: Patient tolerated treatment well Patient left: with call bell/phone within reach (EOB)  OT Visit Diagnosis: Muscle weakness (generalized) (M62.81);Pain Pain - part of body:  (Back)                Time: 5525-8948 OT Time Calculation (min): 15 min Charges:  OT General Charges $OT Visit: 1 Visit OT Evaluation $OT Eval Low Complexity: 1 Low  Becky Pham MSOT, OTR/L Acute Rehab Pager: [phone] Office: [place] 10/11/2020, 8:32 AM

## 2020-10-22 ENCOUNTER — Encounter: Payer: Self-pay | Admitting: Obstetrics

## 2020-10-22 ENCOUNTER — Other Ambulatory Visit (HOSPITAL_COMMUNITY)
Admission: RE | Admit: 2020-10-22 | Discharge: 2020-10-22 | Disposition: A | Discharge status: Home / Self Care | Payer: Medicaid Other | Source: Ambulatory Visit | Attending: Obstetrics | Admitting: Obstetrics

## 2020-10-22 ENCOUNTER — Ambulatory Visit (INDEPENDENT_AMBULATORY_CARE_PROVIDER_SITE_OTHER): Payer: Medicaid Other | Admitting: Obstetrics

## 2020-10-22 ENCOUNTER — Other Ambulatory Visit: Payer: Self-pay

## 2020-10-22 VITALS — BP 109/72 | HR 81 | Ht 64.0 in | Wt 171.0 lb

## 2020-10-22 DIAGNOSIS — N898 Other specified noninflammatory disorders of vagina: Secondary | ICD-10-CM | POA: Diagnosis not present

## 2020-10-22 DIAGNOSIS — N760 Acute vaginitis: Secondary | ICD-10-CM

## 2020-10-22 DIAGNOSIS — B9689 Other specified bacterial agents as the cause of diseases classified elsewhere: Secondary | ICD-10-CM

## 2020-10-22 DIAGNOSIS — Z01419 Encounter for gynecological examination (general) (routine) without abnormal findings: Secondary | ICD-10-CM | POA: Diagnosis present

## 2020-10-22 DIAGNOSIS — N946 Dysmenorrhea, unspecified: Secondary | ICD-10-CM | POA: Diagnosis not present

## 2020-10-22 DIAGNOSIS — F17200 Nicotine dependence, unspecified, uncomplicated: Secondary | ICD-10-CM

## 2020-10-22 MED ORDER — METRONIDAZOLE 500 MG PO TABS: 500.0000 mg | ORAL_TABLET | Freq: Two times a day (BID) | ORAL | 2 refills | Status: AC

## 2020-10-22 MED ORDER — IBUPROFEN 800 MG PO TABS: 800.0000 mg | ORAL_TABLET | Freq: Three times a day (TID) | ORAL | 5 refills | Status: AC | PRN

## 2020-10-22 NOTE — Patient Instructions (Signed)
Bacterial Vaginosis  Bacterial vaginosis is an infection that occurs when the normal balance of bacteria in the vagina changes. This change is caused by an overgrowth of certain bacteria in the vagina. Bacterial vaginosis is the most common vaginal infection among females aged 39 to 44 years. This condition increases the risk of sexually transmitted infections (STIs). Treatment can help reduce this risk. Treatment is very important for pregnant women because this condition can cause babies to be born early (prematurely) or at a low birth weight. What are the causes? This condition is caused by an increase in harmful bacteria that are normally present in small amounts in the vagina. However, the exact reason this condition develops is not known. You cannot get bacterial vaginosis from toilet seats, bedding, swimming pools, or contact with objects around you. What increases the risk? The following factors may make you more likely to develop this condition:  Having a new sexual partner or multiple sexual partners, or having unprotected sex.  Douching.  Having an intrauterine device (IUD).  Smoking.  Abusing drugs and alcohol. This may lead to riskier sexual behavior.  Taking certain antibiotic medicines.  Being pregnant. What are the signs or symptoms? Some women with this condition have no symptoms. Symptoms may include:  Gray or white vaginal discharge. The discharge can be watery or foamy.  A fish-like odor with discharge, especially after sex or during menstruation.  Itching in and around the vagina.  Burning or pain with urination. How is this diagnosed? This condition is diagnosed based on:  Your medical history.  A physical exam of the vagina.  Checking a sample of vaginal fluid for harmful bacteria or abnormal cells. How is this treated? This condition is treated with antibiotic medicines. These may be given as a pill, a vaginal cream, or a medicine that is put into the  vagina (suppository). If the condition comes back after treatment, a second round of antibiotics may be needed. Follow these instructions at home: Medicines  Take or apply over-the-counter and prescription medicines only as told by your health care provider.  Take or apply your antibiotic medicine as told by your health care provider. Do not stop using the antibiotic even if you start to feel better. General instructions  If you have a female sexual partner, tell her that you have a vaginal infection. She should follow up with her health care provider. If you have a female sexual partner, he does not need treatment.  Avoid sexual activity until you finish treatment.  Drink enough fluid to keep your urine pale yellow.  Keep the area around your vagina and rectum clean. ? Wash the area daily with warm water. ? Wipe yourself from front to back after using the toilet.  If you are breastfeeding, talk to your health care provider about continuing breastfeeding during treatment.  Keep all follow-up visits. This is important. How is this prevented? Self-care  Do not douche.  Wash the outside of your vagina with warm water only.  Wear cotton or cotton-lined underwear.  Avoid wearing tight pants and pantyhose, especially during the summer. Safe sex  Use protection when having sex. This includes: ? Using condoms. ? Using dental dams. This is a thin layer of a material made of latex or polyurethane that protects the mouth during oral sex.  Limit the number of sexual partners. To help prevent bacterial vaginosis, it is best to have sex with just one partner (monogamous relationship).  Make sure you and your sexual partner   are tested for STIs. Drugs and alcohol  Do not use any products that contain nicotine or tobacco. These products include cigarettes, chewing tobacco, and vaping devices, such as e-cigarettes. If you need help quitting, ask your health care provider.  Do not use  drugs.  Do not drink alcohol if: ? Your health care provider tells you not to do this. ? You are pregnant, may be pregnant, or are planning to become pregnant.  If you drink alcohol: ? Limit how much you have to 0-1 drink a day. ? Be aware of how much alcohol is in your drink. In the [place] one drink equals one 12 oz bottle of beer (355 mL), one 5 oz glass of wine (148 mL), or one 1 oz glass of hard liquor (44 mL). Where to find more information  Centers for Disease Control and Prevention: www.cdc.gov  American Sexual Health Association (ASHA): www.ashastd.org  [place] Department of Health and Human Services, Office on Women's Health: www.womenshealth.gov Contact a health care provider if:  Your symptoms do not improve, even after treatment.  You have more discharge or pain when urinating.  You have a fever or chills.  You have pain in your abdomen or pelvis.  You have pain during sex.  You have vaginal bleeding between menstrual periods. Summary  Bacterial vaginosis is a vaginal infection that occurs when the normal balance of bacteria in the vagina changes. It results from an overgrowth of certain bacteria.  This condition increases the risk of sexually transmitted infections (STIs). Getting treated can help reduce this risk.  Treatment is very important for pregnant women because this condition can cause babies to be born early (prematurely) or at low birth weight.  This condition is treated with antibiotic medicines. These may be given as a pill, a vaginal cream, or a medicine that is put into the vagina (suppository). This information is not intended to replace advice given to you by your health care provider. Make sure you discuss any questions you have with your health care provider. Document Revised: 11/30/2019 Document Reviewed: 11/30/2019 Elsevier Patient Education  2021 Elsevier Inc.  

## 2020-10-22 NOTE — Progress Notes (Addendum)
Subjective:        Becky Pham is a 40 y.o. female here for a routine exam.  Current complaints: Malodorous vaginal discharge.    Personal health questionnaire:  Is patient [name], have a family history of breast and/or ovarian cancer: no Is there a family history of uterine cancer diagnosed at age < 78, gastrointestinal cancer, urinary tract cancer, family member who is a Personnel officer syndrome-associated carrier: no Is the patient overweight and hypertensive, family history of diabetes, personal history of gestational diabetes, preeclampsia or PCOS: no Is patient over 74, have PCOS,  family history of premature CHD under age 75, diabetes, smoke, have hypertension or peripheral artery disease:  no At any time, has a partner hit, kicked or otherwise hurt or frightened you?: no Over the past 2 weeks, have you felt down, depressed or hopeless?: no Over the past 2 weeks, have you felt little interest or pleasure in doing things?:no   Gynecologic History Patient's last menstrual period was 10/07/2020. Contraception: tubal ligation Last Pap: > 3 years ago. Results were: normal Last mammogram: n/a. Results were: n/a  Obstetric History OB History  No obstetric history on file.    Past Medical History:  Diagnosis Date  . Anemia   . Anxiety   . Asthma   . Back pain   . Bronchitis   . Depression     Past Surgical History:  Procedure Laterality Date  . LUMBAR LAMINECTOMY/DECOMPRESSION MICRODISCECTOMY Left 05/15/2020   Procedure: LEFT-SIDED LUMBAR 5 - SACRUM 1 MICRODISECTOMY;  Surgeon: Estill Bamberg, MD;  Location: [facility] OR;  Service: Orthopedics;  Laterality: Left;  . TRANSFORAMINAL LUMBAR INTERBODY FUSION (TLIF) WITH PEDICLE SCREW FIXATION 1 LEVEL Left 10/10/2020   Procedure: LEFT-LUMBAR 5 - SACRUM 1 TRANSFORAMINAL LUMBAR INTERBODY FUSION WITH INSTRUMENTATION AND ALLOGRAFT;  Surgeon: Estill Bamberg, MD;  Location: [facility] OR;  Service: Orthopedics;  Laterality: Left;  . TUBAL  LIGATION       Current Outpatient Medications:  .  ALBUTEROL IN, Inhale into the lungs., Disp: , Rfl:  .  ARIPiprazole (ABILIFY) 10 MG tablet, Take 10 mg by mouth daily., Disp: , Rfl:  .  cycloSPORINE (RESTASIS) 0.05 % ophthalmic emulsion, 1 drop 2 (two) times daily., Disp: , Rfl:  .  FLUoxetine (PROZAC) 20 MG capsule, Take 20 mg by mouth daily., Disp: , Rfl:  .  gabapentin (NEURONTIN) 100 MG capsule, Take 100 mg by mouth 3 (three) times daily., Disp: , Rfl:  .  ibuprofen (ADVIL) 800 MG tablet, Take 1 tablet (800 mg total) by mouth every 8 (eight) hours as needed., Disp: 30 tablet, Rfl: 5 .  methocarbamol (ROBAXIN) 500 MG tablet, Take 1 tablet (500 mg total) by mouth every 6 (six) hours as needed for muscle spasms., Disp: 30 tablet, Rfl: 2 .  olopatadine (PATANOL) 0.1 % ophthalmic solution, Place 1 drop into both eyes 2 (two) times daily., Disp: , Rfl:  .  oxyCODONE-acetaminophen (PERCOCET/ROXICET) 5-325 MG tablet, Take 1 tablet by mouth every 4 (four) hours as needed for severe pain., Disp: 30 tablet, Rfl: 0 .  Vitamin D, Ergocalciferol, (DRISDOL) 1.25 MG (50000 UNIT) CAPS capsule, Take 5,000 Units by mouth once a week., Disp: , Rfl:  .  acetaminophen (TYLENOL) 500 MG tablet, Take 1,000 mg by mouth every 6 (six) hours as needed for moderate pain., Disp: , Rfl:  .  cetirizine (ZYRTEC) 10 MG tablet, Take 10 mg by mouth daily., Disp: , Rfl:  .  loratadine (CLARITIN) 10 MG tablet, Take  10 mg by mouth daily., Disp: , Rfl:  No Known Allergies  Social History   Tobacco Use  . Smoking status: Current Some Day Smoker    Packs/day: 0.25    Types: Cigarettes  . Smokeless tobacco: Never Used  . Tobacco comment: 3 cigs/day  Substance Use Topics  . Alcohol use: Not Currently    Comment: occ    Family History  Family history unknown: Yes      Review of Systems  Constitutional: negative for fatigue and weight loss Respiratory: negative for cough and wheezing Cardiovascular: negative for chest  pain, fatigue and palpitations Gastrointestinal: negative for abdominal pain and change in bowel habits Musculoskeletal:negative for myalgias Neurological: negative for gait problems and tremors Behavioral/Psych: negative for abusive relationship, depression Endocrine: negative for temperature intolerance    Genitourinary: positive for malodorous vaginal discharge. negative for abnormal menstrual periods, genital lesions, hot flashes, sexual problems Integument/breast: negative for breast lump, breast tenderness, nipple discharge and skin lesion(s)    Objective:       BP 109/72   Pulse 81   Ht 5\' 4"  (1.626 m)   Wt 171 lb (77.6 kg)   LMP 10/07/2020   BMI 29.35 kg/m  General:   alert and no distress  Skin:   no rash or abnormalities  Lungs:   clear to auscultation bilaterally  Heart:   regular rate and rhythm, S1, S2 normal, no murmur, click, rub or gallop  Breasts:   normal without suspicious masses, skin or nipple changes or axillary nodes  Abdomen:  normal findings: no organomegaly, soft, non-tender and no hernia  Pelvis:  External genitalia: normal general appearance Urinary system: urethral meatus normal and bladder without fullness, nontender Vaginal: normal without tenderness, induration or masses Cervix: normal appearance Adnexa: normal bimanual exam Uterus: anteverted and non-tender, normal size   Lab Review Urine pregnancy test Labs reviewed yes Radiologic studies reviewed no  I have spent a total of 20 minutes of face-to-face time, excluding clinical staff time, reviewing notes and preparing to see patient, ordering tests and/or medications, and counseling the patient.  Assessment:     :1. Encounter for gynecological examination with Papanicolaou smear of cervix Rx: - Cytology - PAP( [place])  2. Severe dysmenorrhea Rx: - ibuprofen (ADVIL) 800 MG tablet; Take 1 tablet (800 mg total) by mouth every 8 (eight) hours as needed.  Dispense: 30 tablet; Refill:  5  3. Vaginal discharge Rx: - Cervicovaginal ancillary only( )  4. BV (bacterial vaginosis) Rx: - metroNIDAZOLE (FLAGYL) 500 MG tablet; Take 1 tablet (500 mg total) by mouth 2 (two) times daily.  Dispense: 14 tablet; Refill: 2  5. Tobacco dependence - cessation with the aid of medication and behavioral modification recommended    Plan:    Education reviewed: calcium supplements, depression evaluation, low fat, low cholesterol diet, safe sex/STD prevention, self breast exams, smoking cessation and weight bearing exercise. Follow up in: 1 year.   Meds ordered this encounter  Medications  . ibuprofen (ADVIL) 800 MG tablet    Sig: Take 1 tablet (800 mg total) by mouth every 8 (eight) hours as needed.    Dispense:  30 tablet    Refill:  5     10/09/2020, MD 10/22/2020 4:14 PM

## 2020-10-22 NOTE — Progress Notes (Signed)
PHQ-9 score 4 today.   Pt has not had gyn exam in few years.

## 2020-10-22 NOTE — Addendum Note (Signed)
Addended by: Coral Ceo A on: 10/22/2020 04:28 PM   Modules accepted: Orders

## 2020-10-23 ENCOUNTER — Other Ambulatory Visit: Payer: Self-pay | Admitting: Obstetrics

## 2020-10-23 LAB — CERVICOVAGINAL ANCILLARY ONLY
Bacterial Vaginitis (gardnerella): POSITIVE — AB
Candida Glabrata: NEGATIVE
Candida Vaginitis: NEGATIVE
Chlamydia: NEGATIVE
Comment: NEGATIVE
Comment: NEGATIVE
Comment: NEGATIVE
Comment: NEGATIVE
Comment: NEGATIVE
Comment: NORMAL
Neisseria Gonorrhea: NEGATIVE
Trichomonas: POSITIVE — AB

## 2020-10-23 NOTE — Discharge Summary (Signed)
Patient ID: Becky Pham MRN: [id number] DOB/AGE: 39/14/1982 39 y.o.  Admit date: 10/10/2020 Discharge date: 10/11/2020  Admission Diagnoses:  Active Problems:   Radiculopathy   Discharge Diagnoses:  Same  Past Medical History:  Diagnosis Date  . Anemia   . Anxiety   . Asthma   . Back pain   . Bronchitis   . Depression     Surgeries: Procedure(s): LEFT-LUMBAR 5 - SACRUM 1 TRANSFORAMINAL LUMBAR INTERBODY FUSION WITH INSTRUMENTATION AND ALLOGRAFT on 10/10/2020   Consultants: none  Discharged Condition: Improved  Hospital Course: Becky Pham is an 40 y.o. female who was admitted 10/10/2020 for operative treatment of radiculopathy. Patient has severe unremitting pain that affects sleep, daily activities, and work/hobbies. After pre-op clearance the patient was taken to the operating room on 10/10/2020 and underwent  Procedure(s): LEFT-LUMBAR 5 - SACRUM 1 TRANSFORAMINAL LUMBAR INTERBODY FUSION WITH INSTRUMENTATION AND ALLOGRAFT.    Patient was given perioperative antibiotics:  Anti-infectives (From admission, onward)   Start     Dose/Rate Route Frequency Ordered Stop   10/10/20 2000  ceFAZolin (ANCEF) IVPB 2g/100 mL premix        2 g 200 mL/hr over 30 Minutes Intravenous Every 8 hours 10/10/20 1425 10/11/20 0324   10/10/20 0600  ceFAZolin (ANCEF) IVPB 2g/100 mL premix        2 g 200 mL/hr over 30 Minutes Intravenous On call to O.R. 10/10/20 0559 10/10/20 1215       Patient was given sequential compression devices, early ambulation to prevent DVT.  Patient benefited maximally from hospital stay and there were no complications.    Recent vital signs: BP 120/84 (BP Location: Left Arm)   Pulse 75   Temp 98.3 F (36.8 C) (Oral)   Resp 18   Ht 5\' 4"  (1.626 m)   Wt 74.8 kg   LMP 10/07/2020   SpO2 100%   BMI 28.32 kg/m   Discharge Medications:   Allergies as of 10/11/2020   No Known Allergies     Medication List    TAKE these medications   acetaminophen  500 MG tablet Commonly known as: TYLENOL Take 1,000 mg by mouth every 6 (six) hours as needed for moderate pain.   ARIPiprazole 10 MG tablet Commonly known as: ABILIFY Take 10 mg by mouth daily.   cetirizine 10 MG tablet Commonly known as: ZYRTEC Take 10 mg by mouth daily.   FLUoxetine 20 MG capsule Commonly known as: PROZAC Take 20 mg by mouth daily.   loratadine 10 MG tablet Commonly known as: CLARITIN Take 10 mg by mouth daily.   methocarbamol 500 MG tablet Commonly known as: Robaxin Take 1 tablet (500 mg total) by mouth every 6 (six) hours as needed for muscle spasms.   olopatadine 0.1 % ophthalmic solution Commonly known as: PATANOL Place 1 drop into both eyes 2 (two) times daily.   oxyCODONE-acetaminophen 5-325 MG tablet Commonly known as: PERCOCET/ROXICET Take 1 tablet by mouth every 4 (four) hours as needed for severe pain.   Vitamin D (Ergocalciferol) 1.25 MG (50000 UNIT) Caps capsule Commonly known as: DRISDOL Take 5,000 Units by mouth once a week.       Diagnostic Studies: DG Lumbar Spine 2-3 Views  Result Date: 10/10/2020 CLINICAL DATA:  Surgery, elective. Additional history provided: TLIF L5/S1 EXAM: LUMBAR SPINE - 2-3 VIEW; DG C-ARM 1-60 MIN COMPARISON:  MRI of the lumbar spine 08/29/2020 FINDINGS: AP and lateral view intraoperative fluoroscopic images of the lumbosacral spine are  submitted, 2 images total. The lowest well-formed intervertebral disc space is designated L5-S1. On the provided images, a posterior spinal fusion construct is present at L5-S1 (bilateral pedicle screws and vertical interconnecting rods). An interbody device is also now present at L5-S1. IMPRESSION: Two intraoperative fluoroscopic images from L5-S1 fusion, as described. Electronically Signed   By: Jackey Loge DO   On: 10/10/2020 12:58   DG Lumbar Spine 1 View  Result Date: 10/10/2020 CLINICAL DATA:  L5-S1 TLIF localization EXAM: LUMBAR SPINE - 1 VIEW COMPARISON:  08/29/2020  FINDINGS: Single lateral intraoperative radiograph of the lumbar spine was obtained for the purposes of hardware localization. Two surgical instruments project over the posterior soft tissues of the low back. The more superiorly located instrument is posterior to the L4 spinous process. The more inferiorly located instrument is posterior to the L5-S1 disc level. IMPRESSION: Intraoperative localization, as above. Electronically Signed   By: Duanne Guess D.O.   On: 10/10/2020 11:12   DG C-Arm 1-60 Min  Result Date: 10/10/2020 CLINICAL DATA:  Surgery, elective. Additional history provided: TLIF L5/S1 EXAM: LUMBAR SPINE - 2-3 VIEW; DG C-ARM 1-60 MIN COMPARISON:  MRI of the lumbar spine 08/29/2020 FINDINGS: AP and lateral view intraoperative fluoroscopic images of the lumbosacral spine are submitted, 2 images total. The lowest well-formed intervertebral disc space is designated L5-S1. On the provided images, a posterior spinal fusion construct is present at L5-S1 (bilateral pedicle screws and vertical interconnecting rods). An interbody device is also now present at L5-S1. IMPRESSION: Two intraoperative fluoroscopic images from L5-S1 fusion, as described. Electronically Signed   By: Jackey Loge DO   On: 10/10/2020 12:58    Disposition: Discharge disposition: 01-Home or Self Care        POD #1 s/p revision decompression and fusion, doing very well  - up with PT/OT, encourage ambulation - Percocet for pain, Robaxin for muscle spasms -Scripts for pain sent to pharmacy electronically  -D/C instructions sheet printed and in chart -D/C today  -F/U in office 2 weeks   Signed: Eilene Ghazi [name] 10/23/2020, 11:42 AM

## 2020-10-24 ENCOUNTER — Other Ambulatory Visit: Payer: Self-pay | Admitting: Obstetrics

## 2020-10-24 LAB — CYTOLOGY - PAP
Comment: NEGATIVE
Diagnosis: NEGATIVE
High risk HPV: NEGATIVE

## 2020-11-06 ENCOUNTER — Ambulatory Visit (INDEPENDENT_AMBULATORY_CARE_PROVIDER_SITE_OTHER): Payer: Medicaid Other | Admitting: Psychiatry

## 2020-11-06 ENCOUNTER — Encounter (HOSPITAL_COMMUNITY): Payer: Self-pay | Admitting: Psychiatry

## 2020-11-06 ENCOUNTER — Other Ambulatory Visit: Payer: Self-pay

## 2020-11-06 DIAGNOSIS — F1421 Cocaine dependence, in remission: Secondary | ICD-10-CM | POA: Diagnosis not present

## 2020-11-06 DIAGNOSIS — F411 Generalized anxiety disorder: Secondary | ICD-10-CM | POA: Insufficient documentation

## 2020-11-06 DIAGNOSIS — F1290 Cannabis use, unspecified, uncomplicated: Secondary | ICD-10-CM | POA: Diagnosis not present

## 2020-11-06 DIAGNOSIS — F1994 Other psychoactive substance use, unspecified with psychoactive substance-induced mood disorder: Secondary | ICD-10-CM

## 2020-11-06 MED ORDER — HYDROXYZINE HCL 10 MG PO TABS: 10.0000 mg | ORAL_TABLET | Freq: Three times a day (TID) | ORAL | 0 refills | Status: DC | PRN

## 2020-11-06 MED ORDER — FLUOXETINE HCL 20 MG PO CAPS: 20.0000 mg | ORAL_CAPSULE | Freq: Every day | ORAL | 2 refills | Status: DC

## 2020-11-06 MED ORDER — TRAZODONE HCL 50 MG PO TABS: 50.0000 mg | ORAL_TABLET | Freq: Every day | ORAL | 2 refills | Status: DC

## 2020-11-06 MED ORDER — ARIPIPRAZOLE 10 MG PO TABS: 10.0000 mg | ORAL_TABLET | Freq: Every day | ORAL | 2 refills | Status: DC

## 2020-11-06 MED ORDER — LAMOTRIGINE 25 MG PO TABS: 25.0000 mg | ORAL_TABLET | Freq: Every day | ORAL | 2 refills | Status: DC
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
## 2020-12-04 ENCOUNTER — Encounter (HOSPITAL_COMMUNITY): Payer: Medicaid Other | Admitting: Psychiatry

## 2021-01-14 ENCOUNTER — Other Ambulatory Visit (HOSPITAL_COMMUNITY): Payer: Self-pay | Admitting: Psychiatry

## 2021-01-14 DIAGNOSIS — F411 Generalized anxiety disorder: Secondary | ICD-10-CM

## 2021-01-15 ENCOUNTER — Telehealth (HOSPITAL_COMMUNITY): Payer: Self-pay | Admitting: *Deleted

## 2021-01-15 ENCOUNTER — Encounter (HOSPITAL_COMMUNITY): Payer: Self-pay | Admitting: Psychiatry

## 2021-01-15 ENCOUNTER — Telehealth (INDEPENDENT_AMBULATORY_CARE_PROVIDER_SITE_OTHER): Payer: Medicaid Other | Admitting: Psychiatry

## 2021-01-15 DIAGNOSIS — F1994 Other psychoactive substance use, unspecified with psychoactive substance-induced mood disorder: Secondary | ICD-10-CM | POA: Diagnosis not present

## 2021-01-15 DIAGNOSIS — F411 Generalized anxiety disorder: Secondary | ICD-10-CM

## 2021-01-15 MED ORDER — FLUOXETINE HCL 20 MG PO CAPS: 20.0000 mg | ORAL_CAPSULE | Freq: Every day | ORAL | 2 refills | Status: AC

## 2021-01-15 MED ORDER — TRAZODONE HCL 50 MG PO TABS: 50.0000 mg | ORAL_TABLET | Freq: Every day | ORAL | 2 refills | Status: AC

## 2021-01-15 MED ORDER — HYDROXYZINE HCL 10 MG PO TABS: ORAL_TABLET | ORAL | 2 refills | Status: AC

## 2021-01-15 MED ORDER — ARIPIPRAZOLE 10 MG PO TABS
10.0000 mg | ORAL_TABLET | Freq: Every day | ORAL | 2 refills | Status: AC
Start: 2021-01-15 — End: ?

## 2021-01-15 MED ORDER — LAMOTRIGINE 25 MG PO TABS: 75.0000 mg | ORAL_TABLET | Freq: Every day | ORAL | 2 refills | Status: AC

## 2021-01-15 NOTE — Telephone Encounter (Signed)
PA request from patients pharmacy for her aripiprazole. PA obtained, # [id number] and its effective till 01/10/22. Pharmacy notified and it was able to be run thru.

## 2021-01-15 NOTE — Progress Notes (Signed)
BH MD/PA/NP OP Progress Note Virtual Visit via Telephone Note  I connected with Becky Pham on 01/15/21 at  8:30 AM EDT by telephone and verified that I am speaking with the correct person using two identifiers.  Location: Patient: home Provider: Clinic   I discussed the limitations, risks, security and privacy concerns of performing an evaluation and management service by telephone and the availability of in person appointments. I also discussed with the patient that there may be a patient responsible charge related to this service. The patient expressed understanding and agreed to proceed.   I provided 30 minutes of non-face-to-face time during this encounter.   01/15/2021 8:49 AM Becky AltLatisha C Baehr  MRN:  [id number]  Chief Complaint: "Thing are better"  HPI: 40 year old female seen today for follow up psychiatric evaluation.  She has psychiatric history of substance-induced mood disorder (cocaine and marijuana), depression and anxiety.  She is currently managed on trazodone 50 mg nightly, Lamictal 50 mg daily, hydroxyzine 10 mg 3 times daily Prozac 20 mg daily and Abilify 10 mg daily.  She is also prescribed gabapentin 100 mg 3 times daily prescribed by her orthopedic doctor for pain.  She notes her medications are effective in managing her psychiatric condition.   Today she was unable to logon virtually so her assessment was done over the phone.  During exam she was pleasant, cooperative, engaged in conversation.  She informed Clinical research associatewriter that things have been going better.  She notes that she has been sober from marijuana and cocaine for a month and is now living in [place].  She notes that she works 2 jobs at [employer] and [employer].  She notes that her anxiety and depression are minimal.  Provider conducted a GAD-7 and patient scored a 6, at her last visit she scored a 19.  Provider also conducted a PHQ-9 and patient scored a 5, at her last visit she scored a 26.  She endorses  adequate sleep and appetite.  Today she denies SI/HI/AVH or paranoia.  Patient notes at times her mood fluctuates and she is irritable but overall she is doing well.    Patient notes that when she is off her medication she feels like she stutters more and asked provider if this is a potential side effect.  Provider informed patient that her medications generally does not cause stuttering  however her anxiety may be more increased while off medication which may make her stammer with her words.  Today she is agreeable to increasing Lamictal 50 mg to 75 mg for 2 weeks and then increasing to 100 mg to stabilize mood.  She will continue all other medications as scribed.  No other concerns at this time. Visit Diagnosis:    ICD-10-CM   1. Substance induced mood disorder (HCC)  F19.94 ARIPiprazole (ABILIFY) 10 MG tablet    FLUoxetine (PROZAC) 20 MG capsule    lamoTRIgine (LAMICTAL) 25 MG tablet    traZODone (DESYREL) 50 MG tablet    2. Generalized anxiety disorder  F41.1 FLUoxetine (PROZAC) 20 MG capsule    hydrOXYzine (ATARAX/VISTARIL) 10 MG tablet      Past Psychiatric History: depression and anxiety  Past Medical History:  Past Medical History:  Diagnosis Date   Anemia    Anxiety    Asthma    Back pain    Bronchitis    Depression     Past Surgical History:  Procedure Laterality Date   LUMBAR LAMINECTOMY/DECOMPRESSION MICRODISCECTOMY Left 05/15/2020   Procedure: LEFT-SIDED  LUMBAR 5 - SACRUM 1 MICRODISECTOMY;  Surgeon: Estill Bamberg, MD;  Location: [facility] OR;  Service: Orthopedics;  Laterality: Left;   TRANSFORAMINAL LUMBAR INTERBODY FUSION (TLIF) WITH PEDICLE SCREW FIXATION 1 LEVEL Left 10/10/2020   Procedure: LEFT-LUMBAR 5 - SACRUM 1 TRANSFORAMINAL LUMBAR INTERBODY FUSION WITH INSTRUMENTATION AND ALLOGRAFT;  Surgeon: Estill Bamberg, MD;  Location: [facility] OR;  Service: Orthopedics;  Laterality: Left;   TUBAL LIGATION      Family Psychiatric History: Denies  Family History:  Family History   Family history unknown: Yes    Social History:  Social History   Socioeconomic History   Marital status: Single    Spouse name: Not on file   Number of children: Not on file   Years of education: Not on file   Highest education level: Not on file  Occupational History   Not on file  Tobacco Use   Smoking status: Some Days    Packs/day: 0.25    Types: Cigarettes   Smokeless tobacco: Never   Tobacco comments:    3 cigs/day  Vaping Use   Vaping Use: Never used  Substance and Sexual Activity   Alcohol use: Not Currently    Comment: occ   Drug use: Not Currently    Types: Marijuana, "Crack" cocaine    Comment: > 5 years ago   Sexual activity: Not on file  Other Topics Concern   Not on file  Social History Narrative   Not on file   Social Determinants of Health   Financial Resource Strain: Not on file  Food Insecurity: Not on file  Transportation Needs: Not on file  Physical Activity: Not on file  Stress: Not on file  Social Connections: Not on file    Allergies: No Known Allergies  Metabolic Disorder Labs: No results found for: HGBA1C, MPG No results found for: PROLACTIN No results found for: CHOL, TRIG, HDL, CHOLHDL, VLDL, LDLCALC No results found for: TSH  Therapeutic Level Labs: No results found for: LITHIUM No results found for: VALPROATE No components found for:  CBMZ  Current Medications: Current Outpatient Medications  Medication Sig Dispense Refill   acetaminophen (TYLENOL) 500 MG tablet Take 1,000 mg by mouth every 6 (six) hours as needed for moderate pain.     ALBUTEROL IN Inhale into the lungs.     ARIPiprazole (ABILIFY) 10 MG tablet Take 1 tablet (10 mg total) by mouth daily. 30 tablet 2   cycloSPORINE (RESTASIS) 0.05 % ophthalmic emulsion 1 drop 2 (two) times daily.     FLUoxetine (PROZAC) 20 MG capsule Take 1 capsule (20 mg total) by mouth daily. 30 capsule 2   gabapentin (NEURONTIN) 100 MG capsule Take 100 mg by mouth 3 (three) times  daily.     hydrOXYzine (ATARAX/VISTARIL) 10 MG tablet TAKE 1 TABLET(10 MG) BY MOUTH THREE TIMES DAILY AS NEEDED 90 tablet 2   ibuprofen (ADVIL) 800 MG tablet Take 1 tablet (800 mg total) by mouth every 8 (eight) hours as needed. 30 tablet 5   lamoTRIgine (LAMICTAL) 25 MG tablet Take 3 tablets (75 mg total) by mouth daily. 120 tablet 2   methocarbamol (ROBAXIN) 500 MG tablet Take 1 tablet (500 mg total) by mouth every 6 (six) hours as needed for muscle spasms. 30 tablet 2   metroNIDAZOLE (FLAGYL) 500 MG tablet Take 1 tablet (500 mg total) by mouth 2 (two) times daily. 14 tablet 2   olopatadine (PATANOL) 0.1 % ophthalmic solution Place 1 drop into both eyes 2 (two) times  daily.     oxyCODONE-acetaminophen (PERCOCET/ROXICET) 5-325 MG tablet Take 1 tablet by mouth every 4 (four) hours as needed for severe pain. 30 tablet 0   traZODone (DESYREL) 50 MG tablet Take 1 tablet (50 mg total) by mouth at bedtime. 30 tablet 2   Vitamin D, Ergocalciferol, (DRISDOL) 1.25 MG (50000 UNIT) CAPS capsule Take 5,000 Units by mouth once a week.     No current facility-administered medications for this visit.     Musculoskeletal: Strength & Muscle Tone:  Unable to assess due to telephone visit Gait & Station:  Unable to assess due to telephone visi Patient leans: N/A  Psychiatric Specialty Exam: Review of Systems  There were no vitals taken for this visit.There is no height or weight on file to calculate BMI.  General Appearance:  Unable to assess due to telephone visi  Eye Contact:   Unable to assess due to telephone visi  Speech:  Clear and Coherent and Normal Rate  Volume:  Normal  Mood:  Euthymic  Affect:   Unable to assess due to telephone visi  Thought Process:  Coherent, Goal Directed, and Linear  Orientation:  Full (Time, Place, and Person)  Thought Content: WDL and Logical   Suicidal Thoughts:  No  Homicidal Thoughts:  No  Memory:  Immediate;   Good Recent;   Good Remote;   Good  Judgement:   Good  Insight:  Good  Psychomotor Activity:   Unable to assess due to telephone visi  Concentration:  Concentration: Good and Attention Span: Good  Recall:  Good  Fund of Knowledge: Good  Language: Good  Akathisia:  No  Handed:  Right  AIMS (if indicated): not done  Assets:  Communication Skills Desire for Improvement Financial Resources/Insurance Housing Physical Health Social Support  ADL's:  Intact  Cognition: WNL  Sleep:  Good   Screenings: GAD-7    Flowsheet Row Video Visit from 01/15/2021 in [facility] Office Visit from 11/06/2020 in [facility]  Total GAD-7 Score 6 19      PHQ2-9    Flowsheet Row Video Visit from 01/15/2021 in [facility] Office Visit from 11/06/2020 in [facility] Office Visit from 10/22/2020 in [facility]  PHQ-2 Total Score 0 6 2  PHQ-9 Total Score 5 26 4       Flowsheet Row Office Visit from 11/06/2020 in [facility] Admission (Discharged) from 10/10/2020 in [facility]  [facility] Pre-Admission Testing 60 from 10/08/2020 in [facility] PREADMISSION TESTING  C-SSRS RISK CATEGORY Error: Q7 should not be populated when Q6 is No No Risk No Risk        Assessment and Plan: Patient notes that she is doing well on her current medication regimen.  She notes at times she has fluctuations in mood and irritability.  Today she is agreeable to increasing Lamictal 50 mg to 75 mg for 2 weeks and then increasing to 100 mg to help stabilize mood.  She will continue all other medications as prescribed.  1. Substance induced mood disorder (HCC)  Continue- ARIPiprazole (ABILIFY) 10 MG tablet; Take 1 tablet (10 mg total) by mouth daily.  Dispense: 30 tablet; Refill: 2 Continue- FLUoxetine (PROZAC) 20 MG capsule; Take 1 capsule (20 mg total) by mouth daily.   Dispense: 30 capsule; Refill: 2 Continue- lamoTRIgine (LAMICTAL) 25 MG tablet; Take 3 tablets (75 mg total)  by mouth daily.  Dispense: 120 tablet; Refill: 2 Continue- traZODone (DESYREL) 50 MG tablet; Take 1 tablet (50 mg total) by mouth at bedtime.  Dispense: 30 tablet; Refill: 2  2. Generalized anxiety disorder  Continue- FLUoxetine (PROZAC) 20 MG capsule; Take 1 capsule (20 mg total) by mouth daily.  Dispense: 30 capsule; Refill: 2 Continue- hydrOXYzine (ATARAX/VISTARIL) 10 MG tablet; TAKE 1 TABLET(10 MG) BY MOUTH THREE TIMES DAILY AS NEEDED  Dispense: 90 tablet; Refill: 2  Follow up in 3 months   Shanna Cisco, NP 01/15/2021, 8:49 AM

## 2021-04-16 ENCOUNTER — Telehealth (HOSPITAL_COMMUNITY): Payer: Medicaid Other | Admitting: Psychiatry

## 2021-09-09 ENCOUNTER — Other Ambulatory Visit: Payer: Self-pay | Admitting: Nurse Practitioner

## 2021-09-09 DIAGNOSIS — M5416 Radiculopathy, lumbar region: Secondary | ICD-10-CM

## 2021-09-19 ENCOUNTER — Ambulatory Visit
Admission: RE | Admit: 2021-09-19 | Discharge: 2021-09-19 | Disposition: A | Discharge status: Home / Self Care | Payer: Medicaid Other | Source: Ambulatory Visit | Attending: Nurse Practitioner | Admitting: Nurse Practitioner

## 2021-09-19 DIAGNOSIS — M5416 Radiculopathy, lumbar region: Secondary | ICD-10-CM

## 2021-10-18 IMAGING — DX DG LUMBAR SPINE 2-3V
2 series · 2 of 2 positions shown · non-contrast
Comparison: Lumbar spine MRI 05/15/2020.

CLINICAL DATA: Radiculopathy. Lumbosacral radiculopathy. Additional
history provided: Opening films for left-sided lumbar 5-sacrum 1
microdiscectomy.

EXAM:
LUMBAR SPINE - 2-3 VIEW

[l-spine lat (1 of 2)]
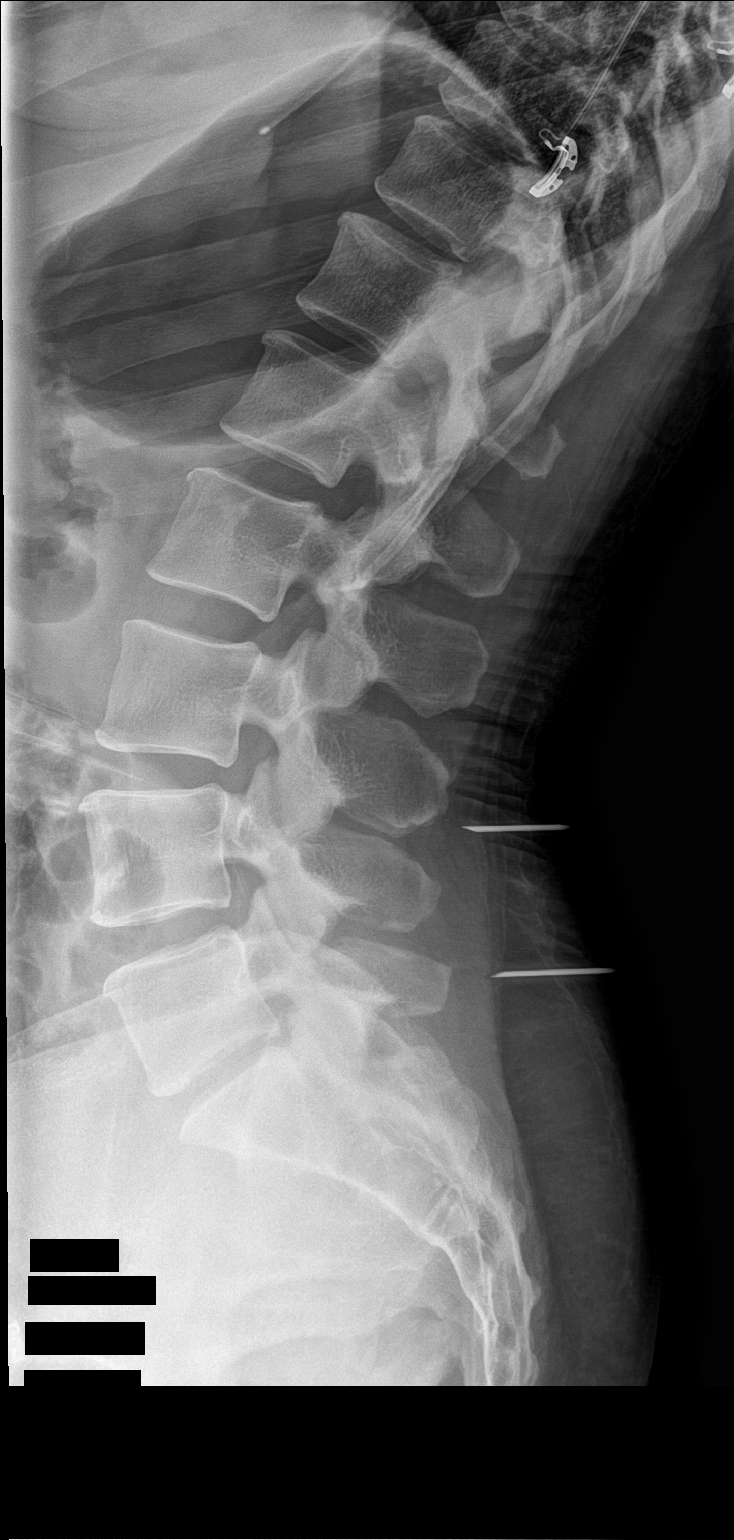

[l-spine lat (2 of 2)]
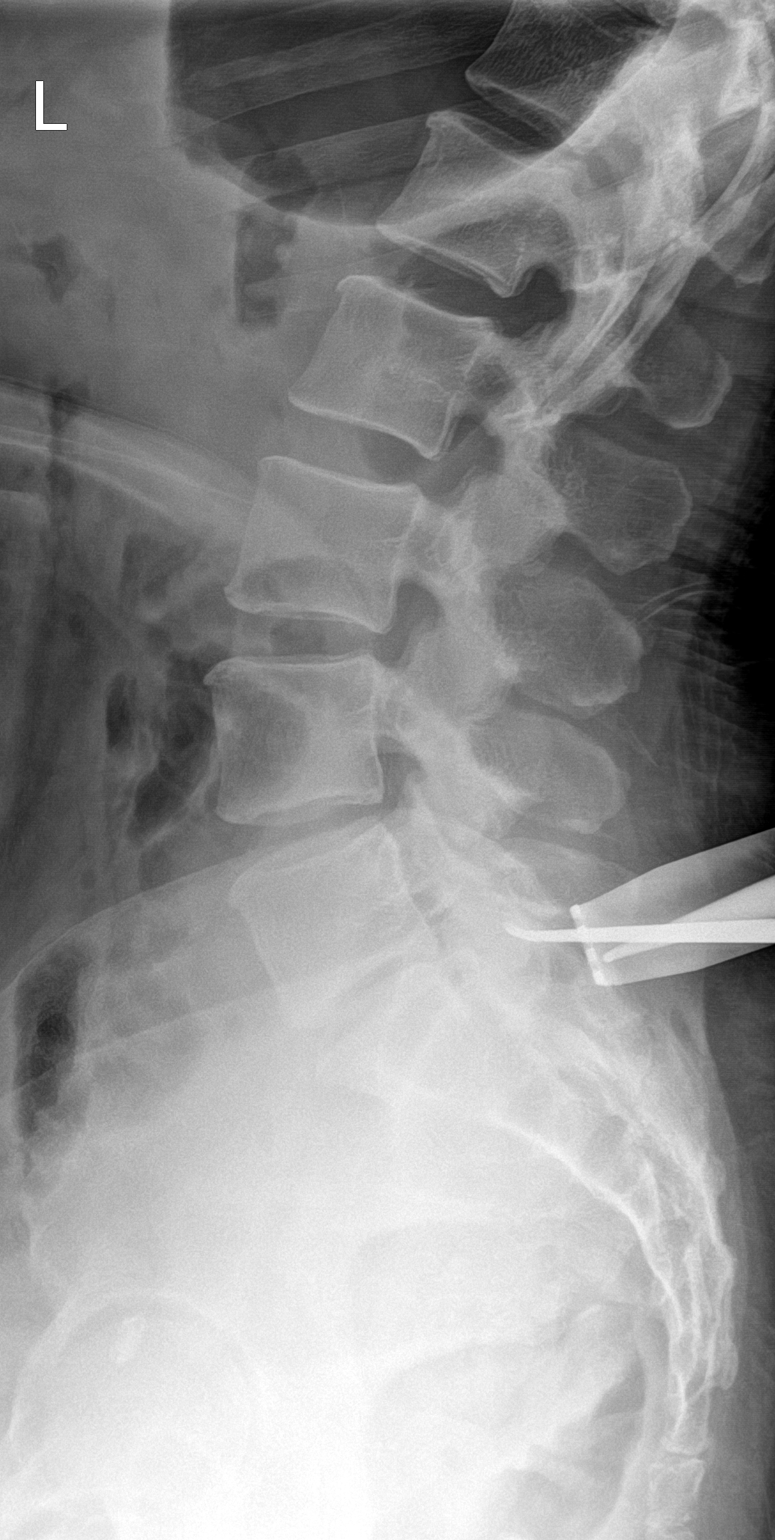

[2 of 2 positions shown; findings below may reference images not displayed]

FINDINGS: Two lateral view intraoperative radiographs of the lumbar spine are
submitted, 2 images total. On the initial radiograph taken at [DATE]
p.m., metallic instruments project posterior to the lumbar spine at
the L4 and L5 levels. On the subsequent radiograph taken at [DATE]
p.m., surgical instruments and retractors project posterior to the
lumbosacral spine at the L5-S1 level.
IMPRESSION: Two intraoperative lateral view radiographs of the lumbar spine as
described.

## 2021-12-09 ENCOUNTER — Ambulatory Visit: Payer: Medicaid Other | Admitting: Podiatry

## 2021-12-09 ENCOUNTER — Telehealth: Payer: Self-pay | Admitting: Podiatry

## 2021-12-09 ENCOUNTER — Ambulatory Visit (INDEPENDENT_AMBULATORY_CARE_PROVIDER_SITE_OTHER): Payer: Medicaid Other

## 2021-12-09 DIAGNOSIS — M79671 Pain in right foot: Secondary | ICD-10-CM

## 2021-12-09 DIAGNOSIS — L600 Ingrowing nail: Secondary | ICD-10-CM | POA: Diagnosis not present

## 2021-12-09 DIAGNOSIS — L853 Xerosis cutis: Secondary | ICD-10-CM

## 2021-12-11 NOTE — Progress Notes (Signed)
Subjective:  Patient ID: Becky Pham, female    DOB: [birth year],  MRN: [id number]  No chief complaint on file.   41 y.o. female presents with the above complaint. Patient presents with complaint of right thickened elongated dystrophic toenail with underlying ingrown.  She states it hurts with ambulation and has progressive gotten worse pain scale 7 out of 10.  She stated hurts with rubbing on it.  She would like to have it removed.  She would like to do it permanently.  She has secondary complaint of dry skin to both heels.  She states that it is getting very worse.  She has tried over-the-counter medication which has helped.  She would like to having a prescription sto   Review of Systems: Negative except as noted in the HPI. Denies N/V/F/Ch.  Past Medical History:  Diagnosis Date   Anemia    Anxiety    Asthma    Back pain    Bronchitis    Depression     Current Outpatient Medications:    acetaminophen (TYLENOL) 500 MG tablet, Take 1,000 mg by mouth every 6 (six) hours as needed for moderate pain., Disp: , Rfl:    ALBUTEROL IN, Inhale into the lungs., Disp: , Rfl:    ARIPiprazole (ABILIFY) 10 MG tablet, Take 1 tablet (10 mg total) by mouth daily., Disp: 30 tablet, Rfl: 2   cycloSPORINE (RESTASIS) 0.05 % ophthalmic emulsion, 1 drop 2 (two) times daily., Disp: , Rfl:    FLUoxetine (PROZAC) 20 MG capsule, Take 1 capsule (20 mg total) by mouth daily., Disp: 30 capsule, Rfl: 2   gabapentin (NEURONTIN) 100 MG capsule, Take 100 mg by mouth 3 (three) times daily., Disp: , Rfl:    hydrOXYzine (ATARAX/VISTARIL) 10 MG tablet, TAKE 1 TABLET(10 MG) BY MOUTH THREE TIMES DAILY AS NEEDED, Disp: 90 tablet, Rfl: 2   ibuprofen (ADVIL) 800 MG tablet, Take 1 tablet (800 mg total) by mouth every 8 (eight) hours as needed., Disp: 30 tablet, Rfl: 5   lamoTRIgine (LAMICTAL) 25 MG tablet, Take 3 tablets (75 mg total) by mouth daily., Disp: 120 tablet, Rfl: 2   methocarbamol (ROBAXIN) 500 MG tablet, Take 1  tablet (500 mg total) by mouth every 6 (six) hours as needed for muscle spasms., Disp: 30 tablet, Rfl: 2   metroNIDAZOLE (FLAGYL) 500 MG tablet, Take 1 tablet (500 mg total) by mouth 2 (two) times daily., Disp: 14 tablet, Rfl: 2   olopatadine (PATANOL) 0.1 % ophthalmic solution, Place 1 drop into both eyes 2 (two) times daily., Disp: , Rfl:    oxyCODONE-acetaminophen (PERCOCET/ROXICET) 5-325 MG tablet, Take 1 tablet by mouth every 4 (four) hours as needed for severe pain., Disp: 30 tablet, Rfl: 0   traZODone (DESYREL) 50 MG tablet, Take 1 tablet (50 mg total) by mouth at bedtime., Disp: 30 tablet, Rfl: 2   Vitamin D, Ergocalciferol, (DRISDOL) 1.25 MG (50000 UNIT) CAPS capsule, Take 5,000 Units by mouth once a week., Disp: , Rfl:   Social History   Tobacco Use  Smoking Status Some Days   Packs/day: 0.25   Types: Cigarettes  Smokeless Tobacco Never  Tobacco Comments   3 cigs/day    No Known Allergies Objective:  There were no vitals filed for this visit. There is no height or weight on file to calculate BMI. Constitutional Well developed. Well nourished.  Vascular Dorsalis pedis pulses palpable bilaterally. Posterior tibial pulses palpable bilaterally. Capillary refill normal to all digits.  No cyanosis or clubbing noted.  Pedal hair growth normal.  Neurologic Normal speech. Oriented to person, place, and time. Epicritic sensation to light touch grossly present bilaterally.  Dermatologic Pain on palpation of the entire/total nail on 1st digit of the right with some underlying ingrown.  Bilateral dry skin noted without fissuring or epidermolysis No other open wounds. No skin lesions.  Orthopedic: Normal joint ROM without pain or crepitus bilaterally. No visible deformities. No bony tenderness.   Radiographs: None Assessment:   1. Ingrown right greater toenail   2. Xerosis of skin    Plan:  Patient was evaluated and treated and all questions answered.  Nail  contusion/dystrophy hallux, right -Patient elects to proceed with minor surgery to remove entire toenail today. Consent reviewed and signed by patient. -Entire/total nail excised. See procedure note. -Educated on post-procedure care including soaking. Written instructions provided and reviewed. -Patient to follow up in 2 weeks for nail check.  Bilateral dry skin -I explained to the patient the etiology of xerosis and various treatment options were extensively discussed.  I explained to the patient the importance of maintaining moisturization of the skin with application of over-the-counter lotion such as Eucerin or Luciderm.  Given that he has she has failed all over-the-counter medications she will benefit from ammonium lactate.  Ammonium lactate was sent to the pharmacy  Procedure: Excision of entire/total nail with underlying phenol matricectomy Location: Right 1st toe digit Anesthesia: Lidocaine 1% plain; 1.5 mL and Marcaine 0.5% plain; 1.5 mL, digital block. Skin Prep: Betadine. Dressing: Silvadene; telfa; dry, sterile, compression dressing. Technique: Following skin prep, the toe was exsanguinated and a tourniquet was secured at the base of the toe. The affected nail border was freed and excised.  Resected was performed in standard technique.  T phenol he tourniquet was then removed and sterile dressing applied. Disposition: Patient tolerated procedure well. Patient to return in 2 weeks for follow-up.   No follow-ups on file.

## 2021-12-18 ENCOUNTER — Telehealth: Payer: Self-pay | Admitting: Podiatry

## 2021-12-18 ENCOUNTER — Other Ambulatory Visit: Payer: Self-pay | Admitting: Podiatry

## 2021-12-18 MED ORDER — AMMONIUM LACTATE 12 % EX LOTN: 1.0000 | TOPICAL_LOTION | CUTANEOUS | 0 refills | Status: DC | PRN

## 2021-12-18 NOTE — Telephone Encounter (Signed)
Pt called asking about her medication that was to have been sent in to the pharmacy. I do not see it and was not sure if it was sent to the compounding pharmacy. Also asked about being scheduled for a brace or something. I did not see anything in the notes about it.

## 2021-12-18 NOTE — Telephone Encounter (Signed)
Can you sent it to [facility] on [address] please

## 2021-12-19 ENCOUNTER — Other Ambulatory Visit: Payer: Self-pay | Admitting: Podiatry

## 2021-12-19 MED ORDER — AMMONIUM LACTATE 12 % EX LOTN: 1.0000 | TOPICAL_LOTION | CUTANEOUS | 0 refills | Status: AC | PRN

## 2021-12-19 NOTE — Telephone Encounter (Signed)
Notified pt medication was sent to [place] office. She said you were to refer her to [facility] and I gave her the number to call them.

## 2022-01-06 ENCOUNTER — Ambulatory Visit: Payer: Medicaid Other | Admitting: Podiatry

## 2022-01-15 ENCOUNTER — Ambulatory Visit: Payer: Medicaid Other | Admitting: Podiatry

## 2022-01-15 DIAGNOSIS — L600 Ingrowing nail: Secondary | ICD-10-CM | POA: Diagnosis not present

## 2022-01-15 NOTE — Progress Notes (Signed)
Subjective:  Patient ID: Becky Pham, female    DOB: 1980/11/12,  MRN: [id number]  Chief Complaint  Patient presents with   Nail Problem    41 y.o. female presents with the above complaint.  Patient presents with right second toe thickened elongated dystrophic mycotic with ingrown.  She patient states painful to touch.  She would like to have removed she does not want to make it permanent.  She wants to see how it grows back.  She denies seeing anyone else prior to seeing me.  The other side is healing well.   Review of Systems: Negative except as noted in the HPI. Denies N/V/F/Ch.  Past Medical History:  Diagnosis Date   Anemia    Anxiety    Asthma    Back pain    Bronchitis    Depression     Current Outpatient Medications:    acetaminophen (TYLENOL) 500 MG tablet, Take 1,000 mg by mouth every 6 (six) hours as needed for moderate pain., Disp: , Rfl:    ALBUTEROL IN, Inhale into the lungs., Disp: , Rfl:    ammonium lactate (AMLACTIN) 12 % lotion, Apply 1 Application topically as needed for dry skin., Disp: 400 g, Rfl: 0   ARIPiprazole (ABILIFY) 10 MG tablet, Take 1 tablet (10 mg total) by mouth daily., Disp: 30 tablet, Rfl: 2   cycloSPORINE (RESTASIS) 0.05 % ophthalmic emulsion, 1 drop 2 (two) times daily., Disp: , Rfl:    FLUoxetine (PROZAC) 20 MG capsule, Take 1 capsule (20 mg total) by mouth daily., Disp: 30 capsule, Rfl: 2   gabapentin (NEURONTIN) 100 MG capsule, Take 100 mg by mouth 3 (three) times daily., Disp: , Rfl:    hydrOXYzine (ATARAX/VISTARIL) 10 MG tablet, TAKE 1 TABLET(10 MG) BY MOUTH THREE TIMES DAILY AS NEEDED, Disp: 90 tablet, Rfl: 2   ibuprofen (ADVIL) 800 MG tablet, Take 1 tablet (800 mg total) by mouth every 8 (eight) hours as needed., Disp: 30 tablet, Rfl: 5   lamoTRIgine (LAMICTAL) 25 MG tablet, Take 3 tablets (75 mg total) by mouth daily., Disp: 120 tablet, Rfl: 2   methocarbamol (ROBAXIN) 500 MG tablet, Take 1 tablet (500 mg total) by mouth every 6 (six)  hours as needed for muscle spasms., Disp: 30 tablet, Rfl: 2   metroNIDAZOLE (FLAGYL) 500 MG tablet, Take 1 tablet (500 mg total) by mouth 2 (two) times daily., Disp: 14 tablet, Rfl: 2   olopatadine (PATANOL) 0.1 % ophthalmic solution, Place 1 drop into both eyes 2 (two) times daily., Disp: , Rfl:    oxyCODONE-acetaminophen (PERCOCET/ROXICET) 5-325 MG tablet, Take 1 tablet by mouth every 4 (four) hours as needed for severe pain., Disp: 30 tablet, Rfl: 0   traZODone (DESYREL) 50 MG tablet, Take 1 tablet (50 mg total) by mouth at bedtime., Disp: 30 tablet, Rfl: 2   Vitamin D, Ergocalciferol, (DRISDOL) 1.25 MG (50000 UNIT) CAPS capsule, Take 5,000 Units by mouth once a week., Disp: , Rfl:   Social History   Tobacco Use  Smoking Status Some Days   Packs/day: 0.25   Types: Cigarettes  Smokeless Tobacco Never  Tobacco Comments   3 cigs/day    No Known Allergies Objective:  There were no vitals filed for this visit. There is no height or weight on file to calculate BMI. Constitutional Well developed. Well nourished.  Vascular Dorsalis pedis pulses palpable bilaterally. Posterior tibial pulses palpable bilaterally. Capillary refill normal to all digits.  No cyanosis or clubbing noted. Pedal hair growth normal.  Neurologic Normal speech. Oriented to person, place, and time. Epicritic sensation to light touch grossly present bilaterally.  Dermatologic Pain on palpation of the entire/total nail on 2nd digit of the right No other open wounds. No skin lesions.  Orthopedic: Normal joint ROM without pain or crepitus bilaterally. No visible deformities. No bony tenderness.   Radiographs: None Assessment:   1. Ingrown nail of second toe of right foot    Plan:  Patient was evaluated and treated and all questions answered.  Nail contusion/dystrophy second with ingrown, right -Patient elects to proceed with minor surgery to remove entire toenail today. Consent reviewed and signed by  patient. -Entire/total nail excised. See procedure note. -Educated on post-procedure care including soaking. Written instructions provided and reviewed. -Patient to follow up in 2 weeks for nail check.  Procedure: Excision of entire/total nail  Location: Right 2nd toe digit Anesthesia: Lidocaine 1% plain; 1.5 mL and Marcaine 0.5% plain; 1.5 mL, digital block. Skin Prep: Betadine. Dressing: Silvadene; telfa; dry, sterile, compression dressing. Technique: Following skin prep, the toe was exsanguinated and a tourniquet was secured at the base of the toe. The affected nail border was freed and excised. The tourniquet was then removed and sterile dressing applied. Disposition: Patient tolerated procedure well. Patient to return in 2 weeks for follow-up.   No follow-ups on file.

## 2022-02-17 ENCOUNTER — Ambulatory Visit: Payer: Medicaid Other | Admitting: Podiatry

## 2022-06-16 ENCOUNTER — Other Ambulatory Visit: Payer: Self-pay | Admitting: Internal Medicine

## 2022-06-17 LAB — COMPLETE METABOLIC PANEL WITH GFR
AG Ratio: 2 (calc) (ref 1.0–2.5)
ALT: 10 U/L (ref 6–29)
AST: 18 U/L (ref 10–30)
Albumin: 4.6 g/dL (ref 3.6–5.1)
Alkaline phosphatase (APISO): 54 U/L (ref 31–125)
BUN: 12 mg/dL (ref 7–25)
CO2: 22 mmol/L (ref 20–32)
Calcium: 9.5 mg/dL (ref 8.6–10.2)
Chloride: 108 mmol/L (ref 98–110)
Creat: 0.82 mg/dL (ref 0.50–0.99)
Globulin: 2.3 g/dL (calc) (ref 1.9–3.7)
Glucose, Bld: 66 mg/dL (ref 65–99)
Potassium: 4.2 mmol/L (ref 3.5–5.3)
Sodium: 142 mmol/L (ref 135–146)
Total Bilirubin: 0.6 mg/dL (ref 0.2–1.2)
Total Protein: 6.9 g/dL (ref 6.1–8.1)
eGFR: 93 mL/min/{1.73_m2} (ref >=60)

## 2022-06-17 LAB — FOLATE: Folate: 16.1 ng/mL

## 2022-06-17 LAB — LIPID PANEL
Cholesterol: 165 mg/dL (ref <200)
HDL: 63 mg/dL (ref >=50)
LDL Cholesterol (Calc): 87 mg/dL (calc)
Non-HDL Cholesterol (Calc): 102 mg/dL (calc) (ref <130)
Total CHOL/HDL Ratio: 2.6 (calc) (ref <5.0)
Triglycerides: 61 mg/dL (ref <150)

## 2022-06-17 LAB — CBC
HCT: 38.3 % (ref 35.0–45.0)
Hemoglobin: 12.4 g/dL (ref 11.7–15.5)
MCH: 27 pg (ref 27.0–33.0)
MCHC: 32.4 g/dL (ref 32.0–36.0)
MCV: 83.3 fL (ref 80.0–100.0)
MPV: 11.4 fL (ref 7.5–12.5)
Platelets: 245 10*3/uL (ref 140–400)
RBC: 4.6 10*6/uL (ref 3.80–5.10)
RDW: 14.7 % (ref 11.0–15.0)
WBC: 5.2 10*3/uL (ref 3.8–10.8)

## 2022-06-17 LAB — VITAMIN D 25 HYDROXY (VIT D DEFICIENCY, FRACTURES): Vit D, 25-Hydroxy: 15 ng/mL — ABNORMAL LOW (ref 30–100)

## 2022-06-17 LAB — VITAMIN B12: Vitamin B-12: 303 pg/mL (ref 200–1100)

## 2022-06-17 LAB — TSH: TSH: 0.73 mIU/L

## 2022-06-24 ENCOUNTER — Emergency Department (HOSPITAL_COMMUNITY)
Admission: EM | Admit: 2022-06-24 | Discharge: 2022-06-25 | Disposition: A | Discharge status: Home / Self Care | Payer: Medicaid Other | Attending: Emergency Medicine | Admitting: Emergency Medicine

## 2022-06-24 ENCOUNTER — Other Ambulatory Visit: Payer: Self-pay

## 2022-06-24 ENCOUNTER — Emergency Department (HOSPITAL_COMMUNITY): Payer: Medicaid Other

## 2022-06-24 ENCOUNTER — Encounter (HOSPITAL_COMMUNITY): Payer: Self-pay

## 2022-06-24 DIAGNOSIS — J45909 Unspecified asthma, uncomplicated: Secondary | ICD-10-CM | POA: Insufficient documentation

## 2022-06-24 DIAGNOSIS — J40 Bronchitis, not specified as acute or chronic: Secondary | ICD-10-CM

## 2022-06-24 DIAGNOSIS — F17210 Nicotine dependence, cigarettes, uncomplicated: Secondary | ICD-10-CM | POA: Insufficient documentation

## 2022-06-24 DIAGNOSIS — Z20822 Contact with and (suspected) exposure to covid-19: Secondary | ICD-10-CM | POA: Insufficient documentation

## 2022-06-24 DIAGNOSIS — J101 Influenza due to other identified influenza virus with other respiratory manifestations: Secondary | ICD-10-CM

## 2022-06-24 DIAGNOSIS — R0602 Shortness of breath: Secondary | ICD-10-CM | POA: Diagnosis present

## 2022-06-24 LAB — RESP PANEL BY RT-PCR (RSV, FLU A&B, COVID)  RVPGX2
Influenza A by PCR: POSITIVE — AB
Influenza B by PCR: NEGATIVE
Resp Syncytial Virus by PCR: NEGATIVE
SARS Coronavirus 2 by RT PCR: NEGATIVE

## 2022-06-24 LAB — BASIC METABOLIC PANEL
Anion gap: 6 (ref 5–15)
BUN: 6 mg/dL (ref 6–20)
CO2: 24 mmol/L (ref 22–32)
Calcium: 8.5 mg/dL — ABNORMAL LOW (ref 8.9–10.3)
Chloride: 106 mmol/L (ref 98–111)
Creatinine, Ser: 0.84 mg/dL (ref 0.44–1.00)
GFR, Estimated: >60 mL/min (ref >60)
Glucose, Bld: 100 mg/dL — ABNORMAL HIGH (ref 70–99)
Potassium: 3.8 mmol/L (ref 3.5–5.1)
Sodium: 136 mmol/L (ref 135–145)

## 2022-06-24 LAB — CBC WITH DIFFERENTIAL/PLATELET
Abs Immature Granulocytes: 0.01 10*3/uL (ref 0.00–0.07)
Basophils Absolute: 0 10*3/uL (ref 0.0–0.1)
Basophils Relative: 0 %
Eosinophils Absolute: 0.1 10*3/uL (ref 0.0–0.5)
Eosinophils Relative: 1 %
HCT: 36.6 % (ref 36.0–46.0)
Hemoglobin: 12 g/dL (ref 12.0–15.0)
Immature Granulocytes: 0 %
Lymphocytes Relative: 24 %
Lymphs Abs: 1.7 10*3/uL (ref 0.7–4.0)
MCH: 26.7 pg (ref 26.0–34.0)
MCHC: 32.8 g/dL (ref 30.0–36.0)
MCV: 81.5 fL (ref 80.0–100.0)
Monocytes Absolute: 0.9 10*3/uL (ref 0.1–1.0)
Monocytes Relative: 12 %
Neutro Abs: 4.4 10*3/uL (ref 1.7–7.7)
Neutrophils Relative %: 63 %
Platelets: 212 10*3/uL (ref 150–400)
RBC: 4.49 MIL/uL (ref 3.87–5.11)
RDW: 15.9 % — ABNORMAL HIGH (ref 11.5–15.5)
WBC: 7.1 10*3/uL (ref 4.0–10.5)
nRBC: 0 % (ref 0.0–0.2)

## 2022-06-24 NOTE — ED Provider Triage Note (Signed)
Emergency Medicine Provider Triage Evaluation Note  Becky Pham , a 42 y.o. female  was evaluated in triage.  Pt complains of cough, chest pain, shortness of breath for 1 month.  Patient reports history of asthma and bronchitis, symptoms not relieved with inhaler or OTC medications.  She has been around a family member who has flu. Review of Systems  Positive: As above Negative: As above  Physical Exam  BP (!) 138/102   Pulse 86   Temp 98.6 F (37 C) (Oral)   Resp 17   Ht 5\' 4"  (1.626 m)   Wt 77.6 kg   SpO2 98%   BMI 29.35 kg/m  Gen:   Awake, no distress   Resp:  Normal effort  MSK:   Moves extremities without difficulty  Other:    Medical Decision Making  Medically screening exam initiated at 9:48 PM.  Appropriate orders placed.  Becky Pham was informed that the remainder of the evaluation will be completed by another provider, this initial triage assessment does not replace that evaluation, and the importance of remaining in the ED until their evaluation is complete.     Rex Kras, [place] 06/25/22 0127

## 2022-06-24 NOTE — ED Triage Notes (Signed)
Pt reports with cough, congestion, and shob x 1 month. Pt states that her chest hurts when she coughs. Pt reports hx of asthma and bronchitis and was seen last week but was unable to get all of her medications.

## 2022-06-25 MED ORDER — ALBUTEROL SULFATE HFA 108 (90 BASE) MCG/ACT IN AERS
2.0000 | INHALATION_SPRAY | Freq: Once | RESPIRATORY_TRACT | Status: AC
  Administered 2022-06-25: 2 via RESPIRATORY_TRACT
  Filled 2022-06-25: qty 6.7

## 2022-06-25 MED ORDER — IPRATROPIUM-ALBUTEROL 0.5-2.5 (3) MG/3ML IN SOLN
3.0000 mL | Freq: Once | RESPIRATORY_TRACT | Status: AC
  Administered 2022-06-25: 3 mL via RESPIRATORY_TRACT
  Filled 2022-06-25: qty 3

## 2022-06-25 NOTE — Discharge Instructions (Addendum)
You are seen in the ER today for flulike symptoms and your cough.  You have influenza A.  You may use the provided albuterol inhaler as needed for your chest tightness.  Follow-up with your primary care doctor and return to the ER with any new severe symptoms.

## 2022-06-25 NOTE — ED Notes (Signed)
Pt. States that she has asthma, and bronchitis. She was given two inhalers by her PCP, but was only able to get the ventolin. She states she still feels SOB, right now will ask the provider for a dose or treatment while she is here.

## 2022-06-25 NOTE — ED Provider Notes (Signed)
[facility] DEPT Provider Note   CSN: [id number] Arrival date & time: 06/24/22  2041     History  Chief Complaint  Patient presents with   Cough   Shortness of Breath   Nasal Congestion    Becky Pham is a 42 y.o. female with history of asthma and daily continues smoking cigarettes who presents with 2 days of sore throat, body aches, worsening cough, chest tightness, fatigue, chills after exposure to her son who is influenza.  No fevers at home.  She is out of her albuterol inhaler.  I reviewed her medical records.  History of polysubstance use, generalized anxiety disorder, asthma, bronchitis.  No anticoagulation.  HPI     Home Medications Prior to Admission medications   Medication Sig Start Date End Date Taking? Authorizing Provider  acetaminophen (TYLENOL) 500 MG tablet Take 1,000 mg by mouth every 6 (six) hours as needed for moderate pain.    [provider]  ALBUTEROL IN Inhale into the lungs.    [provider]  ammonium lactate (AMLACTIN) 12 % lotion Apply 1 Application topically as needed for dry skin. 12/19/21   Felipa Furnace, DPM  ARIPiprazole (ABILIFY) 10 MG tablet Take 1 tablet (10 mg total) by mouth daily. 01/15/21   Eulis Canner E, NP  cycloSPORINE (RESTASIS) 0.05 % ophthalmic emulsion 1 drop 2 (two) times daily.    [provider]  FLUoxetine (PROZAC) 20 MG capsule Take 1 capsule (20 mg total) by mouth daily. 01/15/21   Salley Slaughter, NP  gabapentin (NEURONTIN) 100 MG capsule Take 100 mg by mouth 3 (three) times daily.    [provider]  hydrOXYzine (ATARAX/VISTARIL) 10 MG tablet TAKE 1 TABLET(10 MG) BY MOUTH THREE TIMES DAILY AS NEEDED 01/15/21   Eulis Canner E, NP  ibuprofen (ADVIL) 800 MG tablet Take 1 tablet (800 mg total) by mouth every 8 (eight) hours as needed. 10/22/20   Shelly Bombard, MD  lamoTRIgine (LAMICTAL) 25 MG tablet Take 3 tablets (75 mg total) by mouth daily.  01/15/21   Salley Slaughter, NP  methocarbamol (ROBAXIN) 500 MG tablet Take 1 tablet (500 mg total) by mouth every 6 (six) hours as needed for muscle spasms. 10/11/20   Phylliss Bob, MD  metroNIDAZOLE (FLAGYL) 500 MG tablet Take 1 tablet (500 mg total) by mouth 2 (two) times daily. 10/22/20   Shelly Bombard, MD  olopatadine (PATANOL) 0.1 % ophthalmic solution Place 1 drop into both eyes 2 (two) times daily.    [provider]  oxyCODONE-acetaminophen (PERCOCET/ROXICET) 5-325 MG tablet Take 1 tablet by mouth every 4 (four) hours as needed for severe pain. 10/11/20   Phylliss Bob, MD  traZODone (DESYREL) 50 MG tablet Take 1 tablet (50 mg total) by mouth at bedtime. 01/15/21   Salley Slaughter, NP  Vitamin D, Ergocalciferol, (DRISDOL) 1.25 MG (50000 UNIT) CAPS capsule Take 5,000 Units by mouth once a week. 09/26/20   [provider]      Allergies    Patient has no known allergies.    Review of Systems   Review of Systems  Constitutional:  Positive for activity change, appetite change, chills and fatigue. Negative for fever.  HENT:  Positive for congestion, sinus pressure, sneezing and sore throat.   Respiratory:  Positive for cough.   Cardiovascular: Negative.   Gastrointestinal: Negative.   Genitourinary: Negative.   Musculoskeletal:  Positive for myalgias.  Skin: Negative.     Physical Exam Updated  Vital Signs BP (!) 141/99   Pulse 81   Temp 97.9 F (36.6 C) (Oral)   Resp 14   Ht 5\' 4"  (1.626 m)   Wt 77.6 kg   LMP 06/24/2022   SpO2 99%   BMI 29.35 kg/m  Physical Exam Vitals and nursing note reviewed.  Constitutional:      Appearance: She is not ill-appearing or toxic-appearing.  HENT:     Head: Normocephalic and atraumatic.     Nose: Congestion and rhinorrhea present. Rhinorrhea is clear.     Mouth/Throat:     Mouth: Mucous membranes are moist.     Pharynx: Oropharynx is clear. Uvula midline. Posterior oropharyngeal erythema present. No  oropharyngeal exudate.     Tonsils: No tonsillar exudate or tonsillar abscesses.     Comments: No sublingual or submental tenderness palpation. Eyes:     General:        Right eye: No discharge.        Left eye: No discharge.     Extraocular Movements: Extraocular movements intact.     Conjunctiva/sclera: Conjunctivae normal.     Pupils: Pupils are equal, round, and reactive to light.  Cardiovascular:     Rate and Rhythm: Normal rate and regular rhythm.     Pulses: Normal pulses.  Pulmonary:     Effort: Pulmonary effort is normal. No tachypnea, accessory muscle usage or respiratory distress.     Breath sounds: No stridor. Examination of the right-middle field reveals wheezing. Examination of the left-middle field reveals wheezing. Examination of the right-lower field reveals wheezing. Examination of the left-lower field reveals wheezing. Wheezing present. No decreased breath sounds or rales.  Chest:     Chest wall: No mass, tenderness or edema.  Abdominal:     General: Bowel sounds are normal. There is no distension.     Palpations: Abdomen is soft.     Tenderness: There is no abdominal tenderness.  Musculoskeletal:        General: No deformity.     Cervical back: Neck supple.     Right lower leg: No tenderness. No edema.     Left lower leg: No tenderness. No edema.  Skin:    General: Skin is warm and dry.     Capillary Refill: Capillary refill takes less than 2 seconds.  Neurological:     General: No focal deficit present.     Mental Status: She is alert and oriented to person, place, and time. Mental status is at baseline.  Psychiatric:        Mood and Affect: Mood normal.     ED Results / Procedures / Treatments   Labs (all labs ordered are listed, but only abnormal results are displayed) Labs Reviewed  RESP PANEL BY RT-PCR (RSV, FLU A&B, COVID)  RVPGX2 - Abnormal; Notable for the following components:      Result Value   Influenza A by PCR POSITIVE (*)    All other  components within normal limits  BASIC METABOLIC PANEL - Abnormal; Notable for the following components:   Glucose, Bld 100 (*)    Calcium 8.5 (*)    All other components within normal limits  CBC WITH DIFFERENTIAL/PLATELET - Abnormal; Notable for the following components:   RDW 15.9 (*)    All other components within normal limits    EKG None  Radiology DG Chest 2 View  Result Date: 06/24/2022 CLINICAL DATA:  Shortness of breath EXAM: CHEST - 2 VIEW COMPARISON:  09/09/2016 FINDINGS: Mild  right central peribronchial cuffing. No focal consolidation, pleural effusion, or pneumothorax. No displaced rib fractures. Normal cardiomediastinal silhouette. IMPRESSION: Mild bronchitis/reactive airways. Electronically Signed   By: Minerva Fester M.D.   On: 06/24/2022 22:23    Procedures Procedures   Medications Ordered in ED Medications - No data to display  ED Course/ Medical Decision Making/ A&P                           Medical Decision Making Amount and/or Complexity of Data Reviewed Labs:     Details: CBC without leukocytosis or anemia, BMP unremarkable, patient with influenza A on RVP. Radiology:     Details:  Chest x-ray visualized this provider with mild bronchitis, reactive airway disease, no focal consolidation.  Agree with radiologist interpretation ECG/medicine tests:     Details: EKG with normal sinus rhythm with normal intervals, no STEMI.    Risk Prescription drug management.   Clinical picture most consistent with acute influenza infection as diagnosed in the emergency department today.  Suspect mild asthma exacerbation as etiology of her worsening cough and chest tightness, however physical exam is reassuring and oxygen saturation is normal.  DuoNeb administered in the emergency department and patient provided with albuterol inhaler for home.  No indication for oral steroid at this time.  Clinical concern for emergent underlying etiology that warrant further ED  workup or inpatient management is exceedingly low.  [place]  voiced understanding of her medical evaluation and treatment plan. Each of their questions answered to their expressed satisfaction.  Return precautions were given.  Patient is well-appearing, stable, and was discharged in good condition.  This chart was dictated using voice recognition software, Dragon. Despite the best efforts of this provider to proofread and correct errors, errors may still occur which can change documentation meaning.   Final Clinical Impression(s) / ED Diagnoses Final diagnoses:  None    Rx / DC Orders ED Discharge Orders     None         Sherrilee Gilles 06/25/22 0416    Nira Conn, MD 06/25/22 [phone]

## 2022-06-26 ENCOUNTER — Other Ambulatory Visit: Payer: Self-pay | Admitting: Internal Medicine

## 2022-06-26 DIAGNOSIS — Z1231 Encounter for screening mammogram for malignant neoplasm of breast: Secondary | ICD-10-CM

## 2022-07-02 ENCOUNTER — Ambulatory Visit: Payer: Medicaid Other

## 2022-07-22 ENCOUNTER — Ambulatory Visit: Payer: Medicaid Other | Admitting: Podiatry

## 2022-07-30 ENCOUNTER — Ambulatory Visit: Payer: Medicaid Other | Admitting: Podiatry

## 2022-08-04 ENCOUNTER — Ambulatory Visit: Payer: Medicaid Other | Admitting: Podiatry

## 2022-08-12 ENCOUNTER — Ambulatory Visit (INDEPENDENT_AMBULATORY_CARE_PROVIDER_SITE_OTHER): Payer: Medicaid Other | Admitting: Podiatry

## 2022-08-12 DIAGNOSIS — L600 Ingrowing nail: Secondary | ICD-10-CM

## 2022-08-12 NOTE — Progress Notes (Signed)
Subjective:  Patient ID: Becky Pham, female    DOB: 08/29/1980,  MRN: [id number]  Chief Complaint  Patient presents with   Nail Problem    Removing nail    42 y.o. female presents with the above complaint.  Patient presents with left second digit nail dystrophy.  Patient states been bothersome for quite some time she would like to have it removed and made permanent she does not want keep it.  She has not seen MRIs prior to seeing me.  She denies any other acute issues   Review of Systems: Negative except as noted in the HPI. Denies N/V/F/Ch.  Past Medical History:  Diagnosis Date   Anemia    Anxiety    Asthma    Back pain    Bronchitis    Depression     Current Outpatient Medications:    acetaminophen (TYLENOL) 500 MG tablet, Take 1,000 mg by mouth every 6 (six) hours as needed for moderate pain., Disp: , Rfl:    ALBUTEROL IN, Inhale into the lungs., Disp: , Rfl:    ammonium lactate (AMLACTIN) 12 % lotion, Apply 1 Application topically as needed for dry skin., Disp: 400 g, Rfl: 0   ARIPiprazole (ABILIFY) 10 MG tablet, Take 1 tablet (10 mg total) by mouth daily., Disp: 30 tablet, Rfl: 2   cycloSPORINE (RESTASIS) 0.05 % ophthalmic emulsion, 1 drop 2 (two) times daily., Disp: , Rfl:    FLUoxetine (PROZAC) 20 MG capsule, Take 1 capsule (20 mg total) by mouth daily., Disp: 30 capsule, Rfl: 2   gabapentin (NEURONTIN) 100 MG capsule, Take 100 mg by mouth 3 (three) times daily., Disp: , Rfl:    hydrOXYzine (ATARAX/VISTARIL) 10 MG tablet, TAKE 1 TABLET(10 MG) BY MOUTH THREE TIMES DAILY AS NEEDED, Disp: 90 tablet, Rfl: 2   ibuprofen (ADVIL) 800 MG tablet, Take 1 tablet (800 mg total) by mouth every 8 (eight) hours as needed., Disp: 30 tablet, Rfl: 5   lamoTRIgine (LAMICTAL) 25 MG tablet, Take 3 tablets (75 mg total) by mouth daily., Disp: 120 tablet, Rfl: 2   methocarbamol (ROBAXIN) 500 MG tablet, Take 1 tablet (500 mg total) by mouth every 6 (six) hours as needed for muscle spasms.,  Disp: 30 tablet, Rfl: 2   metroNIDAZOLE (FLAGYL) 500 MG tablet, Take 1 tablet (500 mg total) by mouth 2 (two) times daily., Disp: 14 tablet, Rfl: 2   olopatadine (PATANOL) 0.1 % ophthalmic solution, Place 1 drop into both eyes 2 (two) times daily., Disp: , Rfl:    oxyCODONE-acetaminophen (PERCOCET/ROXICET) 5-325 MG tablet, Take 1 tablet by mouth every 4 (four) hours as needed for severe pain., Disp: 30 tablet, Rfl: 0   traZODone (DESYREL) 50 MG tablet, Take 1 tablet (50 mg total) by mouth at bedtime., Disp: 30 tablet, Rfl: 2   Vitamin D, Ergocalciferol, (DRISDOL) 1.25 MG (50000 UNIT) CAPS capsule, Take 5,000 Units by mouth once a week., Disp: , Rfl:   Social History   Tobacco Use  Smoking Status Some Days   Packs/day: 0.25   Types: Cigarettes  Smokeless Tobacco Never  Tobacco Comments   3 cigs/day    No Known Allergies Objective:  There were no vitals filed for this visit. There is no height or weight on file to calculate BMI. Constitutional Well developed. Well nourished.  Vascular Dorsalis pedis pulses palpable bilaterally. Posterior tibial pulses palpable bilaterally. Capillary refill normal to all digits.  No cyanosis or clubbing noted. Pedal hair growth normal.  Neurologic Normal speech. Oriented  to person, place, and time. Epicritic sensation to light touch grossly present bilaterally.  Dermatologic Pain on palpation of the entire/total nail on 2nd digit of the left No other open wounds. No skin lesions.  Orthopedic: Normal joint ROM without pain or crepitus bilaterally. No visible deformities. No bony tenderness.   Radiographs: None Assessment:   1. Ingrown nail of second toe of left foot    Plan:  Patient was evaluated and treated and all questions answered.  Nail contusion/dystrophy second, with underlying ingrown left -Patient elects to proceed with minor surgery to remove entire toenail today. Consent reviewed and signed by patient. -Entire/total nail  excised. See procedure note. -Educated on post-procedure care including soaking. Written instructions provided and reviewed. -Patient to follow up in 2 weeks for nail check.  Procedure: Excision of entire/total nail with phenol matricectomy Location: Left 2nd toe digit Anesthesia: Lidocaine 1% plain; 1.5 mL and Marcaine 0.5% plain; 1.5 mL, digital block. Skin Prep: Betadine. Dressing: Silvadene; telfa; dry, sterile, compression dressing. Technique: Following skin prep, the toe was exsanguinated and a tourniquet was secured at the base of the toe. The affected nail border was freed and excised. The tourniquet was then removed and sterile dressing applied. Disposition: Patient tolerated procedure well. Patient to return in 2 weeks for follow-up.   No follow-ups on file.

## 2022-09-04 ENCOUNTER — Ambulatory Visit: Payer: Medicaid Other

## 2022-09-09 ENCOUNTER — Ambulatory Visit (INDEPENDENT_AMBULATORY_CARE_PROVIDER_SITE_OTHER): Payer: Medicaid Other | Admitting: Podiatry

## 2022-09-09 ENCOUNTER — Ambulatory Visit
Admission: RE | Admit: 2022-09-09 | Discharge: 2022-09-09 | Disposition: A | Discharge status: Home / Self Care | Payer: Medicaid Other | Source: Ambulatory Visit | Attending: Internal Medicine | Admitting: Internal Medicine

## 2022-09-09 DIAGNOSIS — L600 Ingrowing nail: Secondary | ICD-10-CM

## 2022-09-09 DIAGNOSIS — Z1231 Encounter for screening mammogram for malignant neoplasm of breast: Secondary | ICD-10-CM

## 2022-09-09 NOTE — Progress Notes (Signed)
Subjective:  Patient ID: Becky Pham, female    DOB: May 15, 1981,  MRN: [id number]  Chief Complaint  Patient presents with   Nail Problem    Nail removal     42 y.o. female presents with the above complaint.  Patient presents with right second digit nail dystrophy.  Thickened and again dystrophic mycotic nail.  Patient states that the ingrown part of the nail is still bothering her she would like to have it removed and made permanent.  She states it grew back the same way denies any other acute complaints.   Review of Systems: Negative except as noted in the HPI. Denies N/V/F/Ch.  Past Medical History:  Diagnosis Date   Anemia    Anxiety    Asthma    Back pain    Bronchitis    Depression     Current Outpatient Medications:    acetaminophen (TYLENOL) 500 MG tablet, Take 1,000 mg by mouth every 6 (six) hours as needed for moderate pain., Disp: , Rfl:    ALBUTEROL IN, Inhale into the lungs., Disp: , Rfl:    ammonium lactate (AMLACTIN) 12 % lotion, Apply 1 Application topically as needed for dry skin., Disp: 400 g, Rfl: 0   ARIPiprazole (ABILIFY) 10 MG tablet, Take 1 tablet (10 mg total) by mouth daily., Disp: 30 tablet, Rfl: 2   cycloSPORINE (RESTASIS) 0.05 % ophthalmic emulsion, 1 drop 2 (two) times daily., Disp: , Rfl:    FLUoxetine (PROZAC) 20 MG capsule, Take 1 capsule (20 mg total) by mouth daily., Disp: 30 capsule, Rfl: 2   gabapentin (NEURONTIN) 100 MG capsule, Take 100 mg by mouth 3 (three) times daily., Disp: , Rfl:    hydrOXYzine (ATARAX/VISTARIL) 10 MG tablet, TAKE 1 TABLET(10 MG) BY MOUTH THREE TIMES DAILY AS NEEDED, Disp: 90 tablet, Rfl: 2   ibuprofen (ADVIL) 800 MG tablet, Take 1 tablet (800 mg total) by mouth every 8 (eight) hours as needed., Disp: 30 tablet, Rfl: 5   lamoTRIgine (LAMICTAL) 25 MG tablet, Take 3 tablets (75 mg total) by mouth daily., Disp: 120 tablet, Rfl: 2   methocarbamol (ROBAXIN) 500 MG tablet, Take 1 tablet (500 mg total) by mouth every 6 (six)  hours as needed for muscle spasms., Disp: 30 tablet, Rfl: 2   metroNIDAZOLE (FLAGYL) 500 MG tablet, Take 1 tablet (500 mg total) by mouth 2 (two) times daily., Disp: 14 tablet, Rfl: 2   olopatadine (PATANOL) 0.1 % ophthalmic solution, Place 1 drop into both eyes 2 (two) times daily., Disp: , Rfl:    oxyCODONE-acetaminophen (PERCOCET/ROXICET) 5-325 MG tablet, Take 1 tablet by mouth every 4 (four) hours as needed for severe pain., Disp: 30 tablet, Rfl: 0   traZODone (DESYREL) 50 MG tablet, Take 1 tablet (50 mg total) by mouth at bedtime., Disp: 30 tablet, Rfl: 2   Vitamin D, Ergocalciferol, (DRISDOL) 1.25 MG (50000 UNIT) CAPS capsule, Take 5,000 Units by mouth once a week., Disp: , Rfl:   Social History   Tobacco Use  Smoking Status Some Days   Packs/day: .25   Types: Cigarettes  Smokeless Tobacco Never  Tobacco Comments   3 cigs/day    No Known Allergies Objective:  There were no vitals filed for this visit. There is no height or weight on file to calculate BMI. Constitutional Well developed. Well nourished.  Vascular Dorsalis pedis pulses palpable bilaterally. Posterior tibial pulses palpable bilaterally. Capillary refill normal to all digits.  No cyanosis or clubbing noted. Pedal hair growth normal.  Neurologic Normal speech. Oriented to person, place, and time. Epicritic sensation to light touch grossly present bilaterally.  Dermatologic Pain on palpation of the entire/total nail on 2nd digit of the right No other open wounds. No skin lesions.  Orthopedic: Normal joint ROM without pain or crepitus bilaterally. No visible deformities. No bony tenderness.   Radiographs: None Assessment:   1. Ingrown nail of second toe of right foot    Plan:  Patient was evaluated and treated and all questions answered.  Nail contusion/dystrophy second with underlying ingrown, right -Patient elects to proceed with minor surgery to remove entire toenail today. Consent reviewed and signed  by patient. -Entire/total nail excised. See procedure note. -Educated on post-procedure care including soaking. Written instructions provided and reviewed. -Patient to follow up in 2 weeks for nail check.  Procedure: Excision of entire/total nail with underlying matricectomy Location: Right 2nd toe digit Anesthesia: Lidocaine 1% plain; 1.5 mL and Marcaine 0.5% plain; 1.5 mL, digital block. Skin Prep: Betadine. Dressing: Silvadene; telfa; dry, sterile, compression dressing. Technique: Following skin prep, the toe was exsanguinated and a tourniquet was secured at the base of the toe. The affected nail border was freed and excised.  Phenol matricectomy was still performed in standard technique the tourniquet was then removed and sterile dressing applied. Disposition: Patient tolerated procedure well. Patient to return in 2 weeks for follow-up.   No follow-ups on file.

## 2022-09-11 ENCOUNTER — Other Ambulatory Visit: Payer: Self-pay | Admitting: Internal Medicine

## 2022-09-11 DIAGNOSIS — R928 Other abnormal and inconclusive findings on diagnostic imaging of breast: Secondary | ICD-10-CM

## 2022-09-29 ENCOUNTER — Encounter: Payer: Self-pay | Admitting: *Deleted

## 2022-10-07 ENCOUNTER — Ambulatory Visit
Admission: RE | Admit: 2022-10-07 | Discharge: 2022-10-07 | Disposition: A | Discharge status: Home / Self Care | Payer: Medicaid Other | Source: Ambulatory Visit | Attending: Internal Medicine | Admitting: Internal Medicine

## 2022-10-07 ENCOUNTER — Other Ambulatory Visit: Payer: Self-pay | Admitting: Internal Medicine

## 2022-10-07 DIAGNOSIS — R928 Other abnormal and inconclusive findings on diagnostic imaging of breast: Secondary | ICD-10-CM

## 2022-10-07 DIAGNOSIS — N6310 Unspecified lump in the right breast, unspecified quadrant: Secondary | ICD-10-CM

## 2022-10-07 DIAGNOSIS — N6320 Unspecified lump in the left breast, unspecified quadrant: Secondary | ICD-10-CM

## 2023-04-09 ENCOUNTER — Other Ambulatory Visit: Payer: Medicaid Other

## 2023-04-27 ENCOUNTER — Ambulatory Visit
Admission: RE | Admit: 2023-04-27 | Discharge: 2023-04-27 | Disposition: A | Discharge status: Home / Self Care | Payer: MEDICAID | Source: Ambulatory Visit | Attending: Internal Medicine | Admitting: Internal Medicine

## 2023-04-27 ENCOUNTER — Other Ambulatory Visit: Payer: Self-pay | Admitting: Internal Medicine

## 2023-04-27 DIAGNOSIS — N6310 Unspecified lump in the right breast, unspecified quadrant: Secondary | ICD-10-CM

## 2023-04-27 DIAGNOSIS — N6320 Unspecified lump in the left breast, unspecified quadrant: Secondary | ICD-10-CM

## 2023-04-27 DIAGNOSIS — R928 Other abnormal and inconclusive findings on diagnostic imaging of breast: Secondary | ICD-10-CM

## 2023-11-14 ENCOUNTER — Emergency Department (HOSPITAL_COMMUNITY)
Admission: EM | Admit: 2023-11-14 | Discharge: 2023-11-14 | Disposition: A | Discharge status: Home / Self Care | Payer: MEDICAID | Attending: Emergency Medicine | Admitting: Emergency Medicine

## 2023-11-14 DIAGNOSIS — J45909 Unspecified asthma, uncomplicated: Secondary | ICD-10-CM | POA: Diagnosis not present

## 2023-11-14 DIAGNOSIS — Z7951 Long term (current) use of inhaled steroids: Secondary | ICD-10-CM | POA: Diagnosis not present

## 2023-11-14 DIAGNOSIS — M542 Cervicalgia: Secondary | ICD-10-CM | POA: Diagnosis not present

## 2023-11-14 DIAGNOSIS — M25511 Pain in right shoulder: Secondary | ICD-10-CM | POA: Insufficient documentation

## 2023-11-14 MED ORDER — LIDOCAINE 5 % EX PTCH: 1.0000 | MEDICATED_PATCH | CUTANEOUS | 0 refills | Status: AC

## 2023-11-14 MED ORDER — NAPROXEN 500 MG PO TABS: 500.0000 mg | ORAL_TABLET | Freq: Two times a day (BID) | ORAL | 0 refills | Status: AC

## 2023-11-14 MED ORDER — KETOROLAC TROMETHAMINE 30 MG/ML IJ SOLN
30.0000 mg | Freq: Once | INTRAMUSCULAR | Status: AC
  Administered 2023-11-14: 30 mg via INTRAMUSCULAR
  Filled 2023-11-14: qty 1

## 2023-11-14 NOTE — ED Triage Notes (Signed)
 Neck, shoulder, back pain R side. 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 Out of gabapentin  and needs new px with pain doctor. Lido patches not working. Taking OTC pain meds and ineffective. She has hx of back surgery.

## 2023-11-14 NOTE — ED Provider Notes (Signed)
 Becky Pham EMERGENCY DEPARTMENT AT [facility] Provider Note   CSN: [id number] Arrival date & time: 11/14/23  7425     History  Chief Complaint  Patient presents with   Shoulder Pain    Becky Pham is a 43 y.o. female.  The history is provided by the patient.  Shoulder Pain Location:  Shoulder Shoulder location:  R shoulder Injury: no   Pain details:    Quality:  Aching   Severity:  Severe   Onset quality:  Gradual   Timing:  Constant   Progression:  Unchanged Relieved by:  Nothing Worsened by:  Nothing Ineffective treatments:  None tried Associated symptoms: no back pain   Risk factors: no concern for non-accidental trauma       Past Medical History:  Diagnosis Date   Anemia    Anxiety    Asthma    Back pain    Bronchitis    Depression      Home Medications Prior to Admission medications   Medication Sig Start Date End Date Taking? Authorizing Provider  acetaminophen  (TYLENOL ) 500 MG tablet Take 1,000 mg by mouth every 6 (six) hours as needed for moderate pain.    [provider]  ALBUTEROL  IN Inhale into the lungs.    [provider]  ammonium lactate  (AMLACTIN) 12 % lotion Apply 1 Application topically as needed for dry skin. 12/19/21   Velma Ghazi, DPM  ARIPiprazole  (ABILIFY ) 10 MG tablet Take 1 tablet (10 mg total) by mouth daily. 01/15/21   Ardena Koyanagi E, NP  cycloSPORINE (RESTASIS) 0.05 % ophthalmic emulsion 1 drop 2 (two) times daily.    [provider]  FLUoxetine  (PROZAC ) 20 MG capsule Take 1 capsule (20 mg total) by mouth daily. 01/15/21   Arlyne Bering, NP  gabapentin  (NEURONTIN ) 100 MG capsule Take 100 mg by mouth 3 (three) times daily.    [provider]  hydrOXYzine  (ATARAX /VISTARIL ) 10 MG tablet TAKE 1 TABLET(10 MG) BY MOUTH THREE TIMES DAILY AS NEEDED 01/15/21   Ardena Koyanagi E, NP  ibuprofen  (ADVIL ) 800 MG tablet Take 1 tablet (800 mg total) by mouth every 8 (eight) hours as  needed. 10/22/20   Gabrielle Joiner, MD  lamoTRIgine  (LAMICTAL ) 25 MG tablet Take 3 tablets (75 mg total) by mouth daily. 01/15/21   Arlyne Bering, NP  methocarbamol  (ROBAXIN ) 500 MG tablet Take 1 tablet (500 mg total) by mouth every 6 (six) hours as needed for muscle spasms. 10/11/20   Virl Grimes, MD  metroNIDAZOLE  (FLAGYL ) 500 MG tablet Take 1 tablet (500 mg total) by mouth 2 (two) times daily. 10/22/20   Gabrielle Joiner, MD  olopatadine  (PATANOL) 0.1 % ophthalmic solution Place 1 drop into both eyes 2 (two) times daily.    [provider]  oxyCODONE -acetaminophen  (PERCOCET/ROXICET) 5-325 MG tablet Take 1 tablet by mouth every 4 (four) hours as needed for severe pain. 10/11/20   Virl Grimes, MD  traZODone  (DESYREL ) 50 MG tablet Take 1 tablet (50 mg total) by mouth at bedtime. 01/15/21   Ardena Koyanagi E, NP  Vitamin D , Ergocalciferol , (DRISDOL ) 1.25 MG (50000 UNIT) CAPS capsule Take 5,000 Units by mouth once a week. 09/26/20   [provider]      Allergies    Patient has no known allergies.    Review of Systems   Review of Systems  Musculoskeletal:  Negative for back pain.    Physical Exam Updated Vital Signs BP (!) 144/103  Pulse 79   Temp 98.8 F (37.1 C) (Oral)   Resp 17   SpO2 100%  Physical Exam Vitals and nursing note reviewed.  Constitutional:      General: She is not in acute distress.    Appearance: Normal appearance. She is well-developed.  HENT:     Head: Normocephalic and atraumatic.     Nose: Nose normal.  Eyes:     Pupils: Pupils are equal, round, and reactive to light.  Cardiovascular:     Rate and Rhythm: Normal rate and regular rhythm.     Pulses: Normal pulses.     Heart sounds: Normal heart sounds.  Pulmonary:     Effort: Pulmonary effort is normal. No respiratory distress.     Breath sounds: Normal breath sounds.  Abdominal:     General: Bowel sounds are normal. There is no distension.     Palpations: Abdomen is soft.      Tenderness: There is no abdominal tenderness. There is no guarding or rebound.  Musculoskeletal:        General: Normal range of motion.     Cervical back: Normal range of motion and neck supple.  Skin:    General: Skin is warm and dry.     Capillary Refill: Capillary refill takes less than 2 seconds.     Findings: No erythema or rash.  Neurological:     General: No focal deficit present.     Mental Status: She is alert.     Deep Tendon Reflexes: Reflexes normal.  Psychiatric:        Mood and Affect: Mood normal.     ED Results / Procedures / Treatments   Labs (all labs ordered are listed, but only abnormal results are displayed) Labs Reviewed - No data to display  EKG None  Radiology No results found.  Procedures Procedures    Medications Ordered in ED Medications  ketorolac  (TORADOL ) 30 MG/ML injection 30 mg (has no administration in time range)    ED Course/ Medical Decision Making/ A&P                                 Medical Decision Making Ongoing shoulder pain in pain management   Amount and/or Complexity of Data Reviewed External Data Reviewed: notes.    Details: Previous notes reviewed   Risk Prescription drug management. Risk Details: FROM, rotator cuff is intact.  Stable for discharge.  Follow up with pain management.      Final Clinical Impression(s) / ED Diagnoses Final diagnoses:  None  No signs of systemic illness or infection. The patient is nontoxic-appearing on exam and vital signs are within normal limits.  I have reviewed the triage vital signs and the nursing notes. Pertinent labs & imaging results that were available during my care of the patient were reviewed by me and considered in my medical decision making (see chart for details). After history, exam, and medical workup I feel the patient has been appropriately medically screened and is safe for discharge home. Pertinent diagnoses were discussed with the patient. Patient was given  return precautions.  Rx / DC Orders ED Discharge Orders     None         Errick Salts, MD 11/14/23 0530

## 2023-11-19 ENCOUNTER — Emergency Department (HOSPITAL_COMMUNITY)
Admission: EM | Admit: 2023-11-19 | Discharge: 2023-11-19 | Discharge status: Against Medical Advice | Payer: MEDICAID | Attending: Emergency Medicine | Admitting: Emergency Medicine

## 2023-11-19 ENCOUNTER — Other Ambulatory Visit: Payer: Self-pay

## 2023-11-19 ENCOUNTER — Encounter (HOSPITAL_COMMUNITY): Payer: Self-pay

## 2023-11-19 ENCOUNTER — Emergency Department (HOSPITAL_COMMUNITY): Payer: MEDICAID

## 2023-11-19 DIAGNOSIS — R0602 Shortness of breath: Secondary | ICD-10-CM | POA: Insufficient documentation

## 2023-11-19 DIAGNOSIS — Z5321 Procedure and treatment not carried out due to patient leaving prior to being seen by health care provider: Secondary | ICD-10-CM | POA: Insufficient documentation

## 2023-11-19 MED ORDER — ALBUTEROL SULFATE HFA 108 (90 BASE) MCG/ACT IN AERS: 2.0000 | INHALATION_SPRAY | RESPIRATORY_TRACT | Status: DC | PRN

## 2023-11-19 NOTE — ED Triage Notes (Signed)
 Pt. Arrives for SOB. States that she has run out of her inhaler and it has been hard to breathe without it.

## 2023-11-19 NOTE — ED Notes (Signed)
 Patient left. Stated she need and inhaler and a work note.

## 2023-11-19 NOTE — ED Notes (Signed)
 Pt refused lab work stated, "I just need a pump"

## 2024-04-10 ENCOUNTER — Other Ambulatory Visit: Payer: MEDICAID

## 2024-04-10 ENCOUNTER — Inpatient Hospital Stay: Admission: RE | Admit: 2024-04-10 | Payer: MEDICAID | Source: Ambulatory Visit

## 2024-05-29 ENCOUNTER — Ambulatory Visit (HOSPITAL_COMMUNITY): Payer: MEDICAID | Admitting: Clinical

## 2024-05-29 DIAGNOSIS — F39 Unspecified mood [affective] disorder: Secondary | ICD-10-CM

## 2024-05-29 NOTE — Progress Notes (Signed)
 Therapist unable to complete call to the client due to inactive number.
